# Patient Record
Sex: Female | Born: 1945 | Race: White | Hispanic: No | Marital: Married | Smoking: Former smoker
Health system: Southern US, Community
[De-identification: ages and names within clinical notes are randomized; demographics above are authoritative.]

## PROBLEM LIST (undated history)

## (undated) DIAGNOSIS — I219 Acute myocardial infarction, unspecified: Secondary | ICD-10-CM

## (undated) DIAGNOSIS — I2510 Atherosclerotic heart disease of native coronary artery without angina pectoris: Secondary | ICD-10-CM

## (undated) DIAGNOSIS — E785 Hyperlipidemia, unspecified: Secondary | ICD-10-CM

## (undated) HISTORY — DX: Hyperlipidemia, unspecified: E78.5

## (undated) HISTORY — DX: Atherosclerotic heart disease of native coronary artery without angina pectoris: I25.10

## (undated) HISTORY — DX: Acute myocardial infarction, unspecified: I21.9

## (undated) HISTORY — PX: HERNIA REPAIR: SHX51

## (undated) HISTORY — PX: TUBAL LIGATION: SHX77

---

## 2005-09-13 ENCOUNTER — Ambulatory Visit (HOSPITAL_COMMUNITY): Admission: RE | Admit: 2005-09-13 | Discharge: 2005-09-13 | Payer: Self-pay | Admitting: Obstetrics and Gynecology

## 2009-10-24 ENCOUNTER — Ambulatory Visit (HOSPITAL_COMMUNITY): Admission: RE | Admit: 2009-10-24 | Discharge: 2009-10-24 | Payer: Self-pay | Admitting: Family Medicine

## 2009-10-26 ENCOUNTER — Encounter: Payer: Self-pay | Admitting: Emergency Medicine

## 2009-10-27 ENCOUNTER — Ambulatory Visit: Payer: Self-pay | Admitting: Internal Medicine

## 2009-10-27 ENCOUNTER — Encounter: Payer: Self-pay | Admitting: Cardiology

## 2009-10-27 ENCOUNTER — Inpatient Hospital Stay (HOSPITAL_COMMUNITY): Admission: EM | Admit: 2009-10-27 | Discharge: 2009-10-28 | Payer: Self-pay | Admitting: Internal Medicine

## 2009-10-27 ENCOUNTER — Ambulatory Visit: Payer: Self-pay | Admitting: Surgery

## 2009-10-28 ENCOUNTER — Encounter: Payer: Self-pay | Admitting: Cardiology

## 2009-11-18 DIAGNOSIS — I2510 Atherosclerotic heart disease of native coronary artery without angina pectoris: Secondary | ICD-10-CM

## 2009-11-19 ENCOUNTER — Ambulatory Visit: Payer: Self-pay | Admitting: Cardiology

## 2009-11-19 DIAGNOSIS — R030 Elevated blood-pressure reading, without diagnosis of hypertension: Secondary | ICD-10-CM

## 2009-11-19 DIAGNOSIS — E782 Mixed hyperlipidemia: Secondary | ICD-10-CM | POA: Insufficient documentation

## 2009-12-20 ENCOUNTER — Encounter: Payer: Self-pay | Admitting: Cardiology

## 2009-12-30 ENCOUNTER — Ambulatory Visit: Payer: Self-pay | Admitting: Cardiology

## 2009-12-30 DIAGNOSIS — Z87891 Personal history of nicotine dependence: Secondary | ICD-10-CM

## 2010-02-13 ENCOUNTER — Encounter (INDEPENDENT_AMBULATORY_CARE_PROVIDER_SITE_OTHER): Payer: Self-pay | Admitting: *Deleted

## 2010-03-17 ENCOUNTER — Encounter: Payer: Self-pay | Admitting: Cardiology

## 2010-03-19 ENCOUNTER — Ambulatory Visit
Admission: RE | Admit: 2010-03-19 | Discharge: 2010-03-19 | Payer: Self-pay | Source: Home / Self Care | Attending: Cardiology | Admitting: Cardiology

## 2010-04-14 NOTE — Assessment & Plan Note (Signed)
Summary: EPH- [facility] 8/16   Visit Type:  Follow-up Primary Vanessa Fritz:  Clancy Gourd, NP   History of Present Illness: 65 year old woman presents to the office for the first time in followup of a recent hospital stay at [facility]. Records indicate that she was transferred from [facility] emergency room having presented with chest pain back in mid August. She ruled in for a non-ST elevation myocardial infarction and underwent cardiac catheterization demonstrating multivessel disease as noted below, treated with drug-eluting stent placement to the circumflex as well as left anterior descending.  Laboratory data from August showed a cholesterol of 177, triglyceride 100, HDL 50, LDL 107.  Medical therapy was initiated. She states that she had not been on long-term medications prior to this. I reviewed with her the findings of her cardiac catheterization, importance of continuing dual antiplatelet therapy, basic diet and exercise recommendations. We also discussed potential medication adjustments. She requested all generics if possible. We also discussed cardiac rehabilitation, although she was not interested in pursuing it at this point.  Preventive Screening-Counseling & Management  Alcohol-Tobacco     Smoking Status: quit     Year Quit: August/2011  Current Medications (verified): 1)  Tylenol Arthritis Pain 650 Mg Cr-Tabs (Acetaminophen) .... Take One By Mouth Every 4 Hours As Needed 2)  Metoprolol Tartrate 25 Mg Tabs (Metoprolol Tartrate) .... Take 1/2 Tablet By Mouth Two Times A Day 3)  Crestor 40 Mg Tabs (Rosuvastatin Calcium) .... Take 1 Tablet By Mouth Once A Day 4)  Nitrostat 0.4 Mg Subl (Nitroglycerin) .... Use As Directed Chest Pain 5)  Aspir-Trin 325 Mg Tbec (Aspirin) .... Take 1 Tablet By Mouth Once A Day 6)  Plavix 75 Mg Tabs (Clopidogrel Bisulfate) .... Take 1 Tablet By Mouth Once A Day 7)  Complete  Tabs (Multiple Vitamins-Minerals) .... Take 1 Tablet By Mouth  Once A Day  Allergies: 1)  ! Sulfa  Comments:  Nurse/Medical Assistant: The patient's medication list and allergies were reviewed with the patient and were updated in the Medication and Allergy Lists.  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         Only occasional use of Zantac. Occasional bruising. No major bleeding problems. Otherwise reviewed and negative.  Vital Signs:  Patient profile:   65 year old female Height:      59 inches Weight:      111 pounds BMI:     22.50 Pulse rate:   63 / minute BP sitting:   148 / 88  (left arm) Cuff size:   regular  Vitals Entered By: Carlye Grippe 11/26/09 9:51 AM)  Physical Exam  Additional Exam:  Normally nourished appearing woman in no acute distress. HEENT:  Conjunctiva and lids normal, oropharynx clear. Neck: Supple, no elevated JVP or bruits. Lungs: Clear auscultation, nonlabored. Cardiac: Regular rate and rhythm, no S3 or pericardial rub. Abdomen: Soft, nontender, bowel sounds present. Skin: Warm and dry, no residual ecchymosis. Musculoskeletal: No kyphosis. Extremities: No pitting edema, distal pulses 2+. Neuropsychiatric: Alert and oriented x3,  affect appropriate.   Cardiac Cath  Procedure date:  10/28/2009  Findings:      RESULTS:  The aortic pressure was 111/54 with mean of 78.  Left   ventricular pressure was 111/15.      The left main coronary artery:  The left main coronary artery was free   of significant disease.      The left anterior descending artery:  The left anterior descending   artery gave rise to two diagonal branches and three septal perforators.   There was a 90% stenosis in the mid LAD just before the second diagonal   branch followed by tandem 80 and 80% stenoses in the mid LAD.      The circumflex artery:  The circumflex artery gave rise to four   posterolateral branches.  There was a 40% proximal stenosis in the   circumflex artery.  There were 50 and 40% stenoses in the midvessel and   there was 95% stenosis in the distal vessel before two posterolateral   branches.      The right coronary artery:  The right coronary artery was a moderately   large vessel gave rise to a conus branch, a right ventricular branch,   another right ventricular branch that supplied part of the inferior   septum and a short posterior descending and a large posterolateral   branch.  There were irregularities in the proximal vessel but no   significant obstruction.      The left ventriculogram:  The left ventriculogram performed in the RAO   projection showed good wall motion with no areas of hypokinesis.  The   estimated ejection fraction was 65%.      Following stenting of the lesion in the distal circumflex artery, the   stenosis improved from 95% to 0%.      Following stenting of the tandem lesions in the mid LAD, the stenosis   improved from 90, 80, and 80% to 0%.  EKG  Procedure date:  11/19/2009  Findings:      Sinus rhythm at 64 beats per minute.  Impression & Recommendations:  Problem # 1:  CORONARY ATHEROSCLEROSIS NATIVE CORONARY ARTERY (ICD-414.01)  Symptomatically stable following recent  intervention, on medical therapy. Electrocardiogram is normal. Discussed basic diet and exercise measures. Followup anticipated in 6 weeks.  Her updated medication list for this problem includes:    Metoprolol Tartrate 25 Mg Tabs (Metoprolol tartrate) .Marland Kitchen... Take 1/2 tablet by mouth two times a day    Nitrostat 0.4 Mg Subl (Nitroglycerin) ..... Use as directed chest pain    Aspir-trin 325 Mg Tbec (Aspirin) .Marland Kitchen... Take 1 tablet by mouth once a day    Plavix 75 Mg Tabs (Clopidogrel bisulfate) .Marland Kitchen... Take 1 tablet by mouth once a day  Orders: EKG w/ Interpretation (93000)  Problem # 2:  MIXED HYPERLIPIDEMIA (ICD-272.2)  Plan to change from Crestor to simvastatin 20 g p.o. q.h.s. Followup fasting lipid profile and liver function tests in approximately 12 weeks.  The following medications were removed from the medication list:    Crestor 40 Mg Tabs (Rosuvastatin calcium) .Marland Kitchen... Take 1 tablet by mouth once a day Her updated  medication list for this problem includes:    Simvastatin 20 Mg Tabs (Simvastatin) .Marland Kitchen... Take one tablet by mouth daily at bedtime  Problem # 3:  ELEVATED BLOOD PRESSURE (ICD-796.2)  Reviewed blood pressure parameters with the patient. She states that she does check it at home, recently with a systolic of "112." I asked her to keep a daily log of blood pressure for 2 weeks. Plan to review this when she returns. We discussed sodium restriction.  Patient Instructions: 1)  Follow up with Dr. Diona Browner on Tuesday, December 30, 2009 at 1pm.  2)  Doreatha Martin current supply of Crestor then stop and start (Zocor) simvastatin 20mg  by mouth at bedtime. 3)  Your physician recommends that you go to the [facility] for a FASTING lipid profile and liver function labs:  DO IN 3 MONTHS. YOU WILL RECEIVE A REMINDER LETTER. Do not eat or drink after midnight.  Prescriptions: SIMVASTATIN 20 MG TABS (SIMVASTATIN) Take one tablet by mouth daily at bedtime  #30 x 6   Entered by:   Cyril Loosen, RN,  BSN   Authorized by:   Loreli Slot, MD, [facility]   Signed by:   Cyril Loosen, RN, BSN on 11/19/2009   Method used:   Electronically to        [facility] (retail)       [address]       [place], [place]  [address]       Ph: [phone]       Fax: [phone]   RxID:   [id number]

## 2010-04-14 NOTE — Progress Notes (Signed)
Summary: Office Visit/ BLOOD PRESSURE READINGS  Office Visit/ BLOOD PRESSURE READINGS   Imported By: Dorise Hiss 12/31/2009 14:53:47  _____________________________________________________________________  External Attachment:    Type:   Image     Comment:   External Document

## 2010-04-14 NOTE — Assessment & Plan Note (Signed)
Summary: 6 WK F/U PER 9/7 OV-JM   Visit Type:  Follow-up Primary Provider:  Clancy Gourd, NP   History of Present Illness: 65 year old woman presents for follow-up.  She was seen back in September.  She continues to do well, reporting no angina or unusual breathlessness. She' has been doing some walking with her dog. We discussed regular exercise today. No trouble with return to smoking since cessation back in August.  Follow-up labs from 7 October showed BUN 14, creatinine 0.6, potassium 4.9, AST 15, ALT 14, cholesterol 156, triglycerides 65, HDL 61, LDL 82.  We discussed these today. She is tolerating simvastatin.  She brought in a home blood pressure record which shows good control overall, systolic ranging from 104 up to a high of 120.  Clinical Review Panels:  Cardiac Imaging Cardiac Cath Findings   CARDIAC CATHETERIZATION   CONCLUSION:   1. Coronary artery disease with 90, 80, and 80% stenoses in the mid-       LAD, 40% proximal, 50% mid and 95% distal stenosis in the       circumflex artery, irregularities in the right coronary artery and       normal LV function.   2. Successful PCI of the lesion in the distal circumflex artery using       a PROMUS drug-eluting stent with improvement in center narrowing       from 95% to 0%.   3. Successful PCI of the tandem lesions in the mid LAD with       improvement in the percent diameter narrowing from 90, 80, and 80%       to 0%.      DISPOSITION:  The patient will return to post angio room for further   observation.  She is to remain on Plavix for at least a year.  We will   check a P2Y12 platelet inhibition test tomorrow.               Bruce Elvera Lennox Juanda Chance, MD, [facility]  (10/28/2009)    Preventive Screening-Counseling & Management  Alcohol-Tobacco     Smoking Status: quit     Year Quit: 10/2009  Current Medications (verified): 1)  Tylenol Arthritis Pain 650 Mg Cr-Tabs (Acetaminophen) .... Take One By Mouth Every 4 Hours As  Needed 2)  Metoprolol Tartrate 25 Mg Tabs (Metoprolol Tartrate) .... Take 1/2 Tablet By Mouth Two Times A Day 3)  Nitrostat 0.4 Mg Subl (Nitroglycerin) .... Use As Directed Chest Pain 4)  Aspir-Trin 325 Mg Tbec (Aspirin) .... Take 1 Tablet By Mouth Once A Day 5)  Plavix 75 Mg Tabs (Clopidogrel Bisulfate) .... Take 1 Tablet By Mouth Once A Day 6)  Complete  Tabs (Multiple Vitamins-Minerals) .... Take 1 Tablet By Mouth Once A Day 7)  Simvastatin 20 Mg Tabs (Simvastatin) .... Take One Tablet By Mouth Daily At Bedtime 8)  Vitamin B-12 250 Mcg Tabs (Cyanocobalamin) .... Take 1 Tablet By Mouth Once A Day 9)  Vitamin D 1000 Unit Tabs (Cholecalciferol) .... Take 1 Tablet By Mouth Once A Day  Allergies (verified): 1)  ! Sulfa  Comments:  Nurse/Medical Assistant: The patient's medication bottles and allergies were reviewed with the patient and were updated in the Medication and Allergy Lists.  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         No bleeding problems on aspirin and Plavix. Otherwise reviewed and negative.  Vital Signs:  Patient profile:   65 year old female Height:      59 inches Weight:      113 pounds Pulse rate:   79 / minute BP sitting:   146 / 77  (left arm) Cuff size:   regular  Vitals Entered By: Carlye Grippe (December 30, 2009 1:04 PM)  Physical Exam  Additional Exam:  Normally nourished appearing woman in no acute distress. HEENT: Conjunctiva and lids normal, oropharynx clear. Neck: Supple, no elevated JVP or bruits. Lungs: Clear auscultation, nonlabored. Cardiac: Regular  rate and rhythm, no S3 or pericardial rub. Abdomen: Soft, nontender, bowel sounds present. Skin: Warm and dry, no residual ecchymosis. Musculoskeletal: No kyphosis. Extremities: No pitting edema, distal pulses 2+. Neuropsychiatric: Alert and oriented x3, affect appropriate.   Impression & Recommendations:  Problem # 1:  CORONARY ATHEROSCLEROSIS NATIVE CORONARY ARTERY (ICD-414.01)  Symptomatically stable on present medical regimen. I stressed the importance of exercise and diet. Followup planned for 3 months.  Her updated medication list for this problem includes:    Metoprolol Tartrate 25 Mg Tabs (Metoprolol tartrate) .Marland Kitchen... Take 1/2 tablet by mouth two times a day    Nitrostat 0.4 Mg Subl (Nitroglycerin) ..... Use as directed chest pain    Aspir-trin 325 Mg Tbec (Aspirin) .Marland Kitchen... Take 1 tablet by mouth once a day    Plavix 75 Mg Tabs (Clopidogrel bisulfate) .Marland Kitchen... Take 1 tablet by mouth once a day  Problem # 2:  MIXED HYPERLIPIDEMIA (ICD-272.2)  Reasonable lipid control following switch from Crestor simvastatin.  Her updated medication list for this problem includes:    Simvastatin 20 Mg Tabs (Simvastatin) .Marland Kitchen... Take one tablet by mouth daily at bedtime  Her updated medication list for this problem includes:    Simvastatin 20 Mg Tabs (Simvastatin) .Marland Kitchen... Take one tablet by mouth daily at bedtime  Problem # 3:  ELEVATED BLOOD PRESSURE (ICD-796.2)  Blood pressure overall is very well-controlled based on home checks. It is elevated today. I asked her to keep an eye on this. We discussed sodium restriction.  Problem # 4:  TOBACCO USE, QUIT (ICD-V15.82)  Quit smoking in August. No issues with relapse at this point. Seems to have good family support.  Patient Instructions: 1)  Your physician wants you to follow-up in: 3 months. You will receive a reminder letter in the mail one-two months in advance. If you don't receive a letter, please call our office to schedule the follow-up  appointment. 2)  Your physician recommends that you continue on your current medications as directed. Please refer to the Current Medication list given to you today.

## 2010-04-14 NOTE — Miscellaneous (Signed)
Summary: Orders Update  Clinical Lists Changes  Orders: Added new Test order of T-Lipid Profile ([id number]) - Signed Added new Test order of T-Hepatic Function ([id number]) - Signed 

## 2010-04-16 NOTE — Letter (Signed)
Summary: Vanessa Fritz PRIMARY CARE OFFICE NOTE  [facility] NOTE   Imported By: Claudette Laws 03/18/2010 13:07:13  _____________________________________________________________________  External Attachment:    Type:   Image     Comment:   External Document

## 2010-04-16 NOTE — Assessment & Plan Note (Signed)
Summary: 3 MO FUL   Visit Type:  Follow-up Primary Provider:  Ninfa Linden, NP   History of Present Illness: 65 year old woman presents for followup. She was seen back in October. She reports doing reasonably well, without any active angina. She does report some family stress. Also having difficulty falling asleep. She tends to watch television and try to get to bed around 8 PM. Talked about some better sleep hygiene.  I spoke with her about a basic walking regimen today, also sodium restriction. Blood pressure remains mildly elevated. I suspect she will need additional medication if this does not come under better control.  Followup labs done just recently revealed BUN 14, creatinine 0.8, potassium 4.3, AST 8, ALT 9, total cholesterol 151, triglycerides 85, HDL 60, LDL 74. She is tolerating simvastatin well.  Preventive Screening-Counseling & Management  Alcohol-Tobacco     Smoking Status: quit     Year Quit: 2011  Current Medications (verified): 1)  Tylenol Arthritis Pain 650 Mg Cr-Tabs (Acetaminophen) .... Take One By Mouth Every 4 Hours As Needed 2)  Metoprolol Tartrate 25 Mg Tabs (Metoprolol Tartrate) .... Take 1/2 Tablet By Mouth Two Times A Day 3)  Nitrostat 0.4 Mg Subl (Nitroglycerin) .... Use As Directed Chest Pain 4)  Aspir-Trin 325 Mg Tbec (Aspirin) .... Take 1 Tablet By Mouth Once A Day 5)  Plavix 75 Mg Tabs (Clopidogrel Bisulfate) .... Take 1 Tablet By Mouth Once A Day 6)  Complete  Tabs (Multiple Vitamins-Minerals) .... Take 1 Tablet By Mouth Once A Day 7)  Simvastatin 20 Mg Tabs (Simvastatin) .... Take One Tablet By Mouth Daily At Bedtime 8)  Vitamin B-12 250 Mcg Tabs (Cyanocobalamin) .... Take 1 Tablet By Mouth Once A Day 9)  Vitamin D 2000 Unit Tabs (Cholecalciferol)  Allergies (verified): 1)  ! Sulfa  Comments:  Nurse/Medical Assistant: The patient's medications and allergies were reviewed with the patient and were updated in the Medication and Allergy Lists.  reviewed list w/ patient.  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         Reports some recent "sinus trouble," otherwise reviewed and negative.  Vital Signs:  Patient profile:   65 year old female Height:      59 inches Weight:      109 pounds BMI:     22.09 Pulse rate:   69 / minute BP sitting:   139 / 85  (left arm) Cuff size:   regular  Vitals Entered By: Fuller Plan CMA (March 19, 2010 9:24 AM)  Physical Exam  Additional Exam:  Normally nourished appearing woman in no acute distress. HEENT: Conjunctiva and lids normal, oropharynx clear. Neck: Supple, no elevated JVP or bruits. Lungs: Clear auscultation, nonlabored. Cardiac: Regular rate and rhythm, no S3 or pericardial rub. Abdomen: Soft, nontender, bowel sounds present. Extremities: No pitting edema, distal pulses 2+. Neuropsychiatric: Alert and oriented x3, affect appropriate.   Impression & Recommendations:  Problem # 1:  CORONARY ATHEROSCLEROSIS NATIVE CORONARY ARTERY (ICD-414.01)  Symptomatically stable on medical therapy. Anticipate routine followup in 6 months, sooner if needed.  Her updated medication list for this problem includes:    Metoprolol Tartrate 25 Mg Tabs (Metoprolol  tartrate) .Marland Kitchen... Take 1/2 tablet by mouth two times a day    Nitrostat 0.4 Mg Subl (Nitroglycerin) ..... Use as directed chest pain    Aspir-trin 325 Mg Tbec (Aspirin) .Marland Kitchen... Take 1 tablet by mouth once a day    Plavix 75 Mg Tabs (Clopidogrel bisulfate) .Marland Kitchen... Take 1 tablet by mouth once a day  Problem # 2:  TOBACCO USE, QUIT (ICD-V15.82)  Has not returned to smoking.  Problem # 3:   ELEVATED BLOOD PRESSURE (ICD-796.2)  This remains an issue. I reinforced sodium restriction and exercise. She is shortly not overweight. I suspect she may need to be placed on additional medical therapy. Further advancing metoprolol will probably not be of much benefit particularly in light of her low resting heart rate. Additional consideration would be for an ACE inhibitor as a next step.  Problem # 4:  MIXED HYPERLIPIDEMIA (ICD-272.2)  Lipid numbers look good. She is tolerating simvastatin well.  Her updated medication list for this problem includes:    Simvastatin 20 Mg Tabs (Simvastatin) .Marland Kitchen... Take one tablet by mouth daily at bedtime  Her updated medication list for this problem includes:    Simvastatin 20 Mg Tabs (Simvastatin) .Marland Kitchen... Take one tablet by mouth daily at bedtime  Patient Instructions: 1)  Your physician recommends that you continue on your current medications as directed. Please refer to the Current Medication list given to you today. 2)  Follow up in  6 months.

## 2010-05-29 LAB — POCT CARDIAC MARKERS
CKMB, poc: 1 ng/mL (ref 1.0–8.0)
CKMB, poc: 4.6 ng/mL (ref 1.0–8.0)
Troponin i, poc: <0.05 ng/mL (ref 0.00–0.09)

## 2010-05-29 LAB — BASIC METABOLIC PANEL
CO2: 25 mEq/L (ref 19–32)
Calcium: 9 mg/dL (ref 8.4–10.5)
Creatinine, Ser: 0.87 mg/dL (ref 0.4–1.2)
GFR calc non Af Amer: >60 mL/min (ref >60)
Glucose, Bld: 92 mg/dL (ref 70–99)
Sodium: 140 mEq/L (ref 135–145)

## 2010-05-29 LAB — CBC
HCT: 35.1 % — ABNORMAL LOW (ref 36.0–46.0)
Platelets: 232 10*3/uL (ref 150–400)
Platelets: 239 10*3/uL (ref 150–400)
RBC: 3.94 MIL/uL (ref 3.87–5.11)
RBC: 4.01 MIL/uL (ref 3.87–5.11)
RDW: 13 % (ref 11.5–15.5)
RDW: 12.9 % (ref 11.5–15.5)
WBC: 8.7 10*3/uL (ref 4.0–10.5)
WBC: 7.9 10*3/uL (ref 4.0–10.5)

## 2010-05-29 LAB — COMPREHENSIVE METABOLIC PANEL
ALT: 11 U/L (ref 0–35)
AST: 14 U/L (ref 0–37)
Albumin: 4 g/dL (ref 3.5–5.2)
Alkaline Phosphatase: 72 U/L (ref 39–117)
Chloride: 101 mEq/L (ref 96–112)
Potassium: 3.8 mEq/L (ref 3.5–5.1)
Sodium: 130 mEq/L — ABNORMAL LOW (ref 135–145)
Total Protein: 6 g/dL (ref 6.0–8.3)

## 2010-05-29 LAB — DIFFERENTIAL
Basophils Absolute: 0 10*3/uL (ref 0.0–0.1)
Basophils Relative: 1 % (ref 0–1)
Eosinophils Absolute: 0.1 10*3/uL (ref 0.0–0.7)
Eosinophils Relative: 1 % (ref 0–5)
Lymphocytes Relative: 24 % (ref 12–46)
Lymphs Abs: 2.1 10*3/uL (ref 0.7–4.0)
Monocytes Absolute: 0.6 10*3/uL (ref 0.1–1.0)
Monocytes Absolute: 0.4 10*3/uL (ref 0.1–1.0)
Monocytes Relative: 5 % (ref 3–12)
Neutro Abs: 5.9 10*3/uL (ref 1.7–7.7)

## 2010-05-29 LAB — CARDIAC PANEL(CRET KIN+CKTOT+MB+TROPI)
Total CK: 125 U/L (ref 7–177)
Troponin I: 1.86 ng/mL (ref 0.00–0.06)

## 2010-05-29 LAB — APTT: aPTT: 33 seconds (ref 24–37)

## 2010-05-29 LAB — PLATELET INHIBITION P2Y12
P2Y12 % Inhibition: 69 %
Platelet Function Baseline: 395 [PRU] (ref 194–418)

## 2010-05-29 LAB — HEPARIN LEVEL (UNFRACTIONATED): Heparin Unfractionated: 0.87 IU/mL — ABNORMAL HIGH (ref 0.30–0.70)

## 2010-05-29 LAB — LIPID PANEL
Cholesterol: 177 mg/dL (ref 0–200)
HDL: 50 mg/dL (ref >39)
Total CHOL/HDL Ratio: 3.5 RATIO
Triglycerides: 100 mg/dL (ref <150)

## 2010-08-26 ENCOUNTER — Encounter: Payer: Self-pay | Admitting: Cardiology

## 2010-09-14 ENCOUNTER — Other Ambulatory Visit (HOSPITAL_COMMUNITY): Payer: Self-pay | Admitting: Nurse Practitioner

## 2010-09-14 DIAGNOSIS — Z139 Encounter for screening, unspecified: Secondary | ICD-10-CM

## 2010-09-22 ENCOUNTER — Ambulatory Visit (INDEPENDENT_AMBULATORY_CARE_PROVIDER_SITE_OTHER): Payer: Medicare Other | Admitting: Cardiology

## 2010-09-22 ENCOUNTER — Encounter: Payer: Self-pay | Admitting: Cardiology

## 2010-09-22 VITALS — BP 141/82 | HR 67 | Ht 59.0 in | Wt 107.0 lb

## 2010-09-22 DIAGNOSIS — Z87891 Personal history of nicotine dependence: Secondary | ICD-10-CM

## 2010-09-22 DIAGNOSIS — E782 Mixed hyperlipidemia: Secondary | ICD-10-CM

## 2010-09-22 DIAGNOSIS — I2510 Atherosclerotic heart disease of native coronary artery without angina pectoris: Secondary | ICD-10-CM

## 2010-09-22 DIAGNOSIS — R030 Elevated blood-pressure reading, without diagnosis of hypertension: Secondary | ICD-10-CM

## 2010-09-22 NOTE — Patient Instructions (Signed)
Your physician wants you to follow-up in: 6 months. You will receive a reminder letter in the mail one-two months in advance. If you don't receive a letter, please call our office to schedule the follow-up appointment. Your physician recommends that you continue on your current medications as directed. Please refer to the Current Medication list given to you today. 

## 2010-09-22 NOTE — Assessment & Plan Note (Signed)
Continues with complete smoking cessation.

## 2010-09-22 NOTE — Assessment & Plan Note (Signed)
Symptomatically stable on medical therapy. Continue observation. No changes made to present regimen.

## 2010-09-22 NOTE — Assessment & Plan Note (Signed)
Patient reports typically better blood pressure measurements. Reminded her about sodium restriction and exercise.

## 2010-09-22 NOTE — Progress Notes (Signed)
Clinical Summary Vanessa Fritz is a 65 y.o.female presenting for followup. She was seen in January of this year.  Recent lab work with Dr. Jarold Motto showed BUN 15, creatinine 0.9, sodium 144, potassium 4.7, AST 11, ALT 10, cholesterol 155, triglycerides147, HDL 64, LDL 66. We reviewed these today.  She reports possible episode of angina last month, using nitroglycerin for the first time. She has had no recurrent symptoms. States that she has been walking outdoors in the mornings for exercise. ECG is reviewed below, normal. She is approaching one year out from her drug-eluting stent placement. She reports compliance with her dual antiplatelet therapy.  Has not returned to smoking. States that she has "quit for good."  Allergies  Allergen Reactions  . Sulfonamide Derivatives     REACTION: throat swelling    Current outpatient prescriptions:acetaminophen (TYLENOL) 650 MG CR tablet, Take 650 mg by mouth every 4 (four) hours as needed.  , Disp: , Rfl: ;  aspirin 325 MG EC tablet, Take 325 mg by mouth daily.  , Disp: , Rfl: ;  Cholecalciferol (VITAMIN D3) 400 UNITS CAPS, Take 2 capsules by mouth daily.  , Disp: , Rfl: ;  clopidogrel (PLAVIX) 75 MG tablet, Take 75 mg by mouth daily.  , Disp: , Rfl:  metoprolol tartrate (LOPRESSOR) 25 MG tablet, Take 12.5 mg by mouth 2 (two) times daily.  , Disp: , Rfl: ;  Multiple Vitamins-Minerals (COMPLETE) TABS, Take 1 tablet by mouth daily.  , Disp: , Rfl: ;  nitroGLYCERIN (NITROSTAT) 0.4 MG SL tablet, Place 0.4 mg under the tongue every 5 (five) minutes as needed.  , Disp: , Rfl: ;  simvastatin (ZOCOR) 20 MG tablet, Take 20 mg by mouth at bedtime.  , Disp: , Rfl:  vitamin B-12 (CYANOCOBALAMIN) 250 MCG tablet, Take 250 mcg by mouth daily.  , Disp: , Rfl: ;  DISCONTD: Cholecalciferol (VITAMIN D) 2000 UNITS tablet, Take 2,000 Units by mouth daily.  , Disp: , Rfl:   Past Medical History  Diagnosis Date  . Coronary atherosclerosis of native coronary artery    Multivessel, LVEF 65%, DES circumflex and LAD 8/11  . Hyperlipidemia     Social History Vanessa Fritz reports that she quit smoking about 11 months ago. Her smoking use included Cigarettes. She has a .6 pack-year smoking history. She has never used smokeless tobacco. Vanessa Fritz reports that she does not drink alcohol.  Review of Systems No palpitations or syncope. Reports a stable appetite. No melena or hematochezia. Otherwise negative.  Physical Examination Filed Vitals:   09/22/10 1306  BP: 141/82  Pulse: 67   Normally nourished appearing woman in no acute distress.  HEENT: Conjunctiva and lids normal, oropharynx clear.  Neck: Supple, no elevated JVP or bruits.  Lungs: Clear auscultation, nonlabored.  Cardiac: Regular rate and rhythm, no S3 or pericardial rub.  Abdomen: Soft, nontender, bowel sounds present.  Extremities: No pitting edema, distal pulses 2+.  Neuropsychiatric: Alert and oriented x3, affect appropriate.   ECG Normal sinus rhythm.   Problem List and Plan

## 2010-09-22 NOTE — Assessment & Plan Note (Signed)
Lipids are well controlled. Continue present regimen.

## 2010-10-20 ENCOUNTER — Other Ambulatory Visit: Payer: Self-pay | Admitting: *Deleted

## 2010-10-20 MED ORDER — CLOPIDOGREL BISULFATE 75 MG PO TABS: 75.0000 mg | ORAL_TABLET | Freq: Every day | ORAL | Status: DC

## 2010-10-20 MED ORDER — METOPROLOL TARTRATE 25 MG PO TABS: 12.5000 mg | ORAL_TABLET | Freq: Two times a day (BID) | ORAL | Status: DC

## 2010-10-27 ENCOUNTER — Ambulatory Visit (HOSPITAL_COMMUNITY)
Admission: RE | Admit: 2010-10-27 | Discharge: 2010-10-27 | Disposition: A | Discharge status: Home / Self Care | Payer: Medicare Other | Source: Ambulatory Visit | Attending: Nurse Practitioner | Admitting: Nurse Practitioner

## 2010-10-27 DIAGNOSIS — Z1231 Encounter for screening mammogram for malignant neoplasm of breast: Secondary | ICD-10-CM | POA: Insufficient documentation

## 2010-10-27 DIAGNOSIS — Z139 Encounter for screening, unspecified: Secondary | ICD-10-CM

## 2010-11-27 ENCOUNTER — Encounter (HOSPITAL_COMMUNITY): Payer: Self-pay | Admitting: Emergency Medicine

## 2010-11-27 ENCOUNTER — Emergency Department (HOSPITAL_COMMUNITY)
Admission: EM | Admit: 2010-11-27 | Discharge: 2010-11-27 | Disposition: A | Discharge status: Home / Self Care | Payer: Medicare Other | Attending: Emergency Medicine | Admitting: Emergency Medicine

## 2010-11-27 DIAGNOSIS — N390 Urinary tract infection, site not specified: Secondary | ICD-10-CM

## 2010-11-27 DIAGNOSIS — E785 Hyperlipidemia, unspecified: Secondary | ICD-10-CM | POA: Insufficient documentation

## 2010-11-27 DIAGNOSIS — Z7982 Long term (current) use of aspirin: Secondary | ICD-10-CM | POA: Insufficient documentation

## 2010-11-27 DIAGNOSIS — R109 Unspecified abdominal pain: Secondary | ICD-10-CM | POA: Insufficient documentation

## 2010-11-27 DIAGNOSIS — I252 Old myocardial infarction: Secondary | ICD-10-CM | POA: Insufficient documentation

## 2010-11-27 DIAGNOSIS — I2510 Atherosclerotic heart disease of native coronary artery without angina pectoris: Secondary | ICD-10-CM | POA: Insufficient documentation

## 2010-11-27 DIAGNOSIS — R300 Dysuria: Secondary | ICD-10-CM | POA: Insufficient documentation

## 2010-11-27 DIAGNOSIS — Z87891 Personal history of nicotine dependence: Secondary | ICD-10-CM | POA: Insufficient documentation

## 2010-11-27 LAB — CBC
Hemoglobin: 12.7 g/dL (ref 12.0–15.0)
MCH: 31.2 pg (ref 26.0–34.0)
MCV: 90.7 fL (ref 78.0–100.0)
Platelets: 250 10*3/uL (ref 150–400)
RBC: 4.07 MIL/uL (ref 3.87–5.11)

## 2010-11-27 LAB — DIFFERENTIAL
Eosinophils Absolute: 0.1 10*3/uL (ref 0.0–0.7)
Eosinophils Relative: 1 % (ref 0–5)
Lymphs Abs: 2 10*3/uL (ref 0.7–4.0)
Monocytes Relative: 7 % (ref 3–12)

## 2010-11-27 LAB — BASIC METABOLIC PANEL
BUN: 18 mg/dL (ref 6–23)
Calcium: 9.7 mg/dL (ref 8.4–10.5)
Creatinine, Ser: 0.99 mg/dL (ref 0.50–1.10)
GFR calc non Af Amer: 56 mL/min — ABNORMAL LOW (ref >60)
Glucose, Bld: 113 mg/dL — ABNORMAL HIGH (ref 70–99)

## 2010-11-27 LAB — URINALYSIS, ROUTINE W REFLEX MICROSCOPIC
Bilirubin Urine: NEGATIVE
Specific Gravity, Urine: 1.02 (ref 1.005–1.030)
Urobilinogen, UA: 1 mg/dL (ref 0.0–1.0)

## 2010-11-27 MED ORDER — SODIUM CHLORIDE 0.9 % IV SOLN
Freq: Once | INTRAVENOUS | Status: AC
  Administered 2010-11-27: 20:00:00 via INTRAVENOUS

## 2010-11-27 MED ORDER — CIPROFLOXACIN HCL 500 MG PO TABS: 500.0000 mg | ORAL_TABLET | Freq: Two times a day (BID) | ORAL | Status: AC

## 2010-11-27 MED ORDER — DEXTROSE 5 % IV SOLN
1.0000 g | Freq: Once | INTRAVENOUS | Status: AC
  Administered 2010-11-27: 1 g via INTRAVENOUS
  Filled 2010-11-27: qty 1

## 2010-11-27 NOTE — ED Provider Notes (Signed)
Scribed for Donnetta Hutching, MD, the patient was seen in room APAH1/APAH1 . This chart was scribed by Ellie Lunch. This patient's care was started at 8:36 PM.   CSN: [id number] Arrival date & time: 11/27/2010  7:10 PM   Chief Complaint  Patient presents with  . Flank Pain     (Include location/radiation/quality/duration/timing/severity/associated sxs/prior treatment) HPI RAECHAL RABEN is a 65 y.o. female who presents to the Emergency Department complaining of sudden onset of right flank pain starting this morning with associated dysuria, hematuria, and chills. Pt denies fever, n/v/d. There are no other associated symptoms and no other alleviating or aggravating factors.   PCP Dr. Jarold Motto  Cardiologist: Dr. Diona Browner at [facility]  HPI ELEMENTS:  Location: right flank  Onset: this morning  Timing: constant    Context: as above  Associated symptoms: dysuria, hematuria, chills   Past Medical History  Diagnosis Date  . Coronary atherosclerosis of native coronary artery     Multivessel, LVEF 65%, DES circumflex and LAD 8/11  . Hyperlipidemia   . Heart attack     Past Surgical History  Procedure Date  . Hernia repair     Family History  Problem Relation Age of Onset  . Coronary artery disease Mother     History of MI and stroke  . Coronary artery disease Brother     CABG in his 12s  . Coronary artery disease Brother     Died of MI age 71    History  Substance Use Topics  . Smoking status: Former Smoker -- 0.3 packs/day for 2 years    Types: Cigarettes    Quit date: 10/13/2009  . Smokeless tobacco: Never Used  . Alcohol Use: No    Review of Systems 10 Systems reviewed and are negative for acute change except as noted in the HPI.  Allergies  Sulfonamide derivatives  Home Medications   Current Outpatient Rx  Name Route Sig Dispense Refill  . ASPIRIN 325 MG PO TBEC Oral Take 325 mg by mouth daily.      Marland Kitchen VITAMIN D3 400 UNITS PO CAPS Oral Take 2 capsules by mouth  daily.      Marland Kitchen CLOPIDOGREL BISULFATE 75 MG PO TABS Oral Take 1 tablet (75 mg total) by mouth daily. 30 tablet 6  . METOPROLOL TARTRATE 25 MG PO TABS Oral Take 0.5 tablets (12.5 mg total) by mouth 2 (two) times daily. 30 tablet 6  . COMPLETE PO TABS Oral Take 1 tablet by mouth daily.      Marland Kitchen SIMVASTATIN 20 MG PO TABS Oral Take 20 mg by mouth at bedtime.      Marland Kitchen VITAMIN B-12 250 MCG PO TABS Oral Take 250 mcg by mouth daily.      . ACETAMINOPHEN 650 MG PO TBCR Oral Take 650 mg by mouth every 4 (four) hours as needed. For headaches    . NITROGLYCERIN 0.4 MG SL SUBL Sublingual Place 0.4 mg under the tongue every 5 (five) minutes as needed.        Physical Exam    BP 124/60  Pulse 90  Temp(Src) 98 F (36.7 C) (Oral)  Resp 20  SpO2 100%  Physical Exam  Nursing note and vitals reviewed. Constitutional: She is oriented to person, place, and time. She appears well-developed and well-nourished.  HENT:  Head: Normocephalic and atraumatic.  Eyes: Conjunctivae and EOM are normal.  Neck: Normal range of motion.  Cardiovascular: Normal rate, regular rhythm and normal heart sounds.   Pulmonary/Chest:  Effort normal and breath sounds normal.  Abdominal: Soft. Bowel sounds are normal.  Musculoskeletal: Normal range of motion.       Right flank tenderness  Neurological: She is alert and oriented to person, place, and time.  Skin: Skin is warm and dry.  Psychiatric: She has a normal mood and affect. Judgment normal.   Procedures  OTHER DATA REVIEWED: Nursing notes, vital signs, and past medical records reviewed.   DIAGNOSTIC STUDIES: Oxygen Saturation is 100% on room air, normal by my interpretation.    LABS / RADIOLOGY:  Results for orders placed during the hospital encounter of 11/27/10  URINALYSIS, ROUTINE W REFLEX MICROSCOPIC      Component Value Range   Color, Urine YELLOW  YELLOW    Appearance HAZY (*) CLEAR    Specific Gravity, Urine 1.020  1.005 - 1.030    pH 6.0  5.0 - 8.0     Glucose, UA NEGATIVE  NEGATIVE (mg/dL)   Hgb urine dipstick LARGE (*) NEGATIVE    Bilirubin Urine NEGATIVE  NEGATIVE    Ketones, ur NEGATIVE  NEGATIVE (mg/dL)   Protein, ur 30 (*) NEGATIVE (mg/dL)   Urobilinogen, UA 1.0  0.0 - 1.0 (mg/dL)   Nitrite POSITIVE (*) NEGATIVE    Leukocytes, UA LARGE (*) NEGATIVE   CBC      Component Value Range   WBC 12.0 (*) 4.0 - 10.5 (K/uL)   RBC 4.07  3.87 - 5.11 (MIL/uL)   Hemoglobin 12.7  12.0 - 15.0 (g/dL)   HCT 16.1  09.6 - 04.5 (%)   MCV 90.7  78.0 - 100.0 (fL)   MCH 31.2  26.0 - 34.0 (pg)   MCHC 34.4  30.0 - 36.0 (g/dL)   RDW 40.9  81.1 - 91.4 (%)   Platelets 250  150 - 400 (K/uL)  DIFFERENTIAL      Component Value Range   Neutrophils Relative 75  43 - 77 (%)   Neutro Abs 9.0 (*) 1.7 - 7.7 (K/uL)   Lymphocytes Relative 17  12 - 46 (%)   Lymphs Abs 2.0  0.7 - 4.0 (K/uL)   Monocytes Relative 7  3 - 12 (%)   Monocytes Absolute 0.8  0.1 - 1.0 (K/uL)   Eosinophils Relative 1  0 - 5 (%)   Eosinophils Absolute 0.1  0.0 - 0.7 (K/uL)   Basophils Relative 0  0 - 1 (%)   Basophils Absolute 0.0  0.0 - 0.1 (K/uL)  BASIC METABOLIC PANEL      Component Value Range   Sodium 134 (*) 135 - 145 (mEq/L)   Potassium 4.1  3.5 - 5.1 (mEq/L)   Chloride 98  96 - 112 (mEq/L)   CO2 30  19 - 32 (mEq/L)   Glucose, Bld 113 (*) 70 - 99 (mg/dL)   BUN 18  6 - 23 (mg/dL)   Creatinine, Ser 7.82  0.50 - 1.10 (mg/dL)   Calcium 9.7  8.4 - 95.6 (mg/dL)   GFR calc non Af Amer 56 (*) >60 (mL/min)   GFR calc Af Amer >60  >60 (mL/min)  URINE MICROSCOPIC-ADD ON      Component Value Range   Squamous Epithelial / LPF RARE  RARE    WBC, UA 21-50  <3 (WBC/hpf)   RBC / HPF TOO NUMEROUS TO COUNT  <3 (RBC/hpf)   Bacteria, UA MANY (*) RARE    PROCEDURES:  ED COURSE / COORDINATION OF CARE: 20:37 EDP at PT bedside. Discussed lab results indicating UTI.  Plan to treat with abx and discharge.   MDM: History and physical most consistent with urinary tract infection, stream linearly  Pyelo. Patient is alert, good color, normal vital signs, not dehydrated. Rx IV Rocephin in ED. Culture urine. Discharge home on Cipro.  IMPRESSION: Diagnoses that have been ruled out:  Diagnoses that are still under consideration:  Final diagnoses:    SCRIBE ATTESTATION: I personally performed the services described in this documentation, which was scribed in my presence. The recorded information has been reviewed and considered. Donnetta Hutching, MD           Donnetta Hutching, MD 11/27/10 [phone]

## 2010-11-27 NOTE — ED Notes (Signed)
Sudden onset rt flank pain with bloody urine

## 2011-01-25 ENCOUNTER — Other Ambulatory Visit: Payer: Self-pay | Admitting: *Deleted

## 2011-01-25 MED ORDER — SIMVASTATIN 20 MG PO TABS: 20.0000 mg | ORAL_TABLET | Freq: Every day | ORAL | Status: DC

## 2011-04-15 ENCOUNTER — Other Ambulatory Visit: Payer: Self-pay

## 2011-04-15 ENCOUNTER — Ambulatory Visit (INDEPENDENT_AMBULATORY_CARE_PROVIDER_SITE_OTHER): Payer: Medicare Other | Admitting: Cardiology

## 2011-04-15 ENCOUNTER — Telehealth: Payer: Self-pay | Admitting: Internal Medicine

## 2011-04-15 ENCOUNTER — Encounter: Payer: Self-pay | Admitting: Cardiology

## 2011-04-15 VITALS — BP 147/81 | HR 69 | Ht 59.0 in | Wt 119.0 lb

## 2011-04-15 DIAGNOSIS — I2510 Atherosclerotic heart disease of native coronary artery without angina pectoris: Secondary | ICD-10-CM

## 2011-04-15 DIAGNOSIS — Z139 Encounter for screening, unspecified: Secondary | ICD-10-CM

## 2011-04-15 DIAGNOSIS — E782 Mixed hyperlipidemia: Secondary | ICD-10-CM

## 2011-04-15 DIAGNOSIS — R030 Elevated blood-pressure reading, without diagnosis of hypertension: Secondary | ICD-10-CM

## 2011-04-15 MED ORDER — METOPROLOL SUCCINATE ER 25 MG PO TB24: 25.0000 mg | ORAL_TABLET | Freq: Every day | ORAL | Status: DC

## 2011-04-15 MED ORDER — NITROGLYCERIN 0.4 MG SL SUBL: 0.4000 mg | SUBLINGUAL_TABLET | SUBLINGUAL | Status: DC | PRN

## 2011-04-15 NOTE — Progress Notes (Signed)
   Clinical Summary Vanessa Fritz is a 66 y.o.female presenting for followup. She was seen in July 2012. She is here with her husband, doing well without any active angina. We reviewed her medications and addressed refills. Also discussed switching from Lopressor to Toprol-XL.  Recent lab work reviewed including BUN 18, creatinine 1.0, potassium 4.3, AST 15, ALT 13, cholesterol 155, triglycerides 80, HDL 61, LDL 78, hemoglobin A1c 5.5. I discussed these with her today. She is tolerating Zocor.  We discussed a basic walking regimen. She has not been consistent with this during the winter.   Allergies  Allergen Reactions  . Sulfonamide Derivatives     REACTION: throat swelling    Current Outpatient Prescriptions  Medication Sig Dispense Refill  . acetaminophen (TYLENOL) 650 MG CR tablet Take 650 mg by mouth every 4 (four) hours as needed. For headaches      . aspirin 325 MG EC tablet Take 325 mg by mouth daily.        . Cholecalciferol (VITAMIN D3) 400 UNITS CAPS Take 2 capsules by mouth daily.        . clopidogrel (PLAVIX) 75 MG tablet Take 1 tablet (75 mg total) by mouth daily.  30 tablet  6  . Multiple Vitamins-Minerals (COMPLETE) TABS Take 1 tablet by mouth daily.        . nitroGLYCERIN (NITROSTAT) 0.4 MG SL tablet Place 1 tablet (0.4 mg total) under the tongue every 5 (five) minutes as needed.  25 tablet  3  . simvastatin (ZOCOR) 20 MG tablet Take 1 tablet (20 mg total) by mouth at bedtime.  30 tablet  6  . vitamin B-12 (CYANOCOBALAMIN) 250 MCG tablet Take 250 mcg by mouth daily.        . metoprolol succinate (TOPROL-XL) 25 MG 24 hr tablet Take 1 tablet (25 mg total) by mouth daily.  30 tablet  6    Past Medical History  Diagnosis Date  . Coronary atherosclerosis of native coronary artery     Multivessel, LVEF 65%, DES circumflex and LAD 8/11  . Hyperlipidemia   . Heart attack     Past Surgical History  Procedure Date  . Hernia repair     Family History  Problem Relation  Age of Onset  . Coronary artery disease Mother     History of MI and stroke  . Coronary artery disease Brother     CABG in his 60s  . Coronary artery disease Brother     Died of MI age 49    Social History Ms. Domanski reports that she quit smoking about 18 months ago. Her smoking use included Cigarettes. She has a .6 pack-year smoking history. She has never used smokeless tobacco. Ms. Peeler reports that she does not drink alcohol.  Review of Systems No palpitations or syncope. Stable appetite. No reported bleeding problems. Otherwise reviewed and negative.  Physical Examination Filed Vitals:   04/15/11 1030  BP: 147/81  Pulse: 69   Normally nourished appearing woman in no acute distress.  HEENT: Conjunctiva and lids normal, oropharynx clear.  Neck: Supple, no elevated JVP or bruits.  Lungs: Clear auscultation, nonlabored.  Cardiac: Regular rate and rhythm, no S3 or pericardial rub.  Abdomen: Soft, nontender, bowel sounds present.  Extremities: No pitting edema, distal pulses 2+.  Skin: Warm and dry. Musculoskeletal: No kyphosis. Neuropsychiatric: Alert and oriented x3, affect appropriate.   ECG Reviewed in EMR.   Problem List and Plan

## 2011-04-15 NOTE — Telephone Encounter (Signed)
Pt came into office this afternoon wanting to be triaged for a colonoscopy. She said she had been referred by Ninfa Linden sometime ago and was ready to set this up. I told her to have a seat and the triage nurse would talk with her.

## 2011-04-15 NOTE — Assessment & Plan Note (Signed)
Blood pressure elevated. Once again discussed diet and exercise. May need further medical therapy. Keep followup with primary care provider.

## 2011-04-15 NOTE — Assessment & Plan Note (Signed)
Lipids are well controlled. Continue current regimen.

## 2011-04-15 NOTE — Telephone Encounter (Signed)
Gastroenterology Pre-Procedure Form   Pt said she was supposed to have done in 2011 but she had a heart attack and had to put off for awhile. She said that her cardiologist, Dr. Diona Browner said she would probably go off of Plavix and Asa for a few days Before the procedure.    Request Date: 04/15/2011        Requesting Physician: Ninfa Linden, NP     PATIENT INFORMATION:  Vanessa Fritz is a 66 y.o., female (DOB=[birth year]).  PROCEDURE: Procedure(s) requested: colonoscopy Procedure Reason: screening for colon cancer  PATIENT REVIEW QUESTIONS: The patient reports the following:   1. Diabetes Melitis: no 2. Joint replacements in the past 12 months: no 3. Major health problems in the past 3 months: no 4. Has an artificial valve or MVP:no 5. Has been advised in past to take antibiotics in advance of a procedure like teeth cleaning: no}    MEDICATIONS & ALLERGIES:    Patient reports the following regarding taking any blood thinners:   Plavix? yes  Aspirin?yes  Coumadin?  no  Patient confirms/reports the following medications:  Current Outpatient Prescriptions  Medication Sig Dispense Refill  . acetaminophen (TYLENOL) 650 MG CR tablet Take 650 mg by mouth every 4 (four) hours as needed. For headaches      . aspirin 325 MG EC tablet Take 325 mg by mouth daily.        . Cholecalciferol (VITAMIN D3) 400 UNITS CAPS Take 2 capsules by mouth daily.        . clopidogrel (PLAVIX) 75 MG tablet Take 1 tablet (75 mg total) by mouth daily.  30 tablet  6  . metoprolol succinate (TOPROL-XL) 25 MG 24 hr tablet Take 1 tablet (25 mg total) by mouth daily.  30 tablet  6  . Multiple Vitamins-Minerals (COMPLETE) TABS Take 1 tablet by mouth daily.        . nitroGLYCERIN (NITROSTAT) 0.4 MG SL tablet Place 1 tablet (0.4 mg total) under the tongue every 5 (five) minutes as needed.  25 tablet  3  . simvastatin (ZOCOR) 20 MG tablet Take 1 tablet (20 mg total) by mouth at bedtime.  30 tablet  6  . vitamin  B-12 (CYANOCOBALAMIN) 250 MCG tablet Take 250 mcg by mouth daily.          Patient confirms/reports the following allergies:  Allergies  Allergen Reactions  . Sulfonamide Derivatives     REACTION: throat swelling    Patient is appropriate to schedule for requested procedure(s): yes  AUTHORIZATION INFORMATION Primary Insurance:   ID #:   Group #:  Pre-Cert / Auth required:  Pre-Cert / Auth #:   Secondary Insurance:   ID #:  Group #:  Pre-Cert / Auth required:  Pre-Cert / Auth #:   No orders of the defined types were placed in this encounter.    SCHEDULE INFORMATION: Procedure has been scheduled as follows:  Date: 05/10/2011                  Time: 8:15 AM Location: [facility] Stay  This Gastroenterology Pre-Precedure Form is being routed to the following provider(s) for review: R. Roetta Sessions, MD

## 2011-04-15 NOTE — Assessment & Plan Note (Signed)
Symptomatically stable on medical therapy, ECG is normal. Plan to continue observation, switch from Lopressor to Toprol-XL. Followup arranged.

## 2011-04-15 NOTE — Telephone Encounter (Signed)
OK to schedule. She can stay on both Plavix and ASA. No need to stop.

## 2011-04-15 NOTE — Patient Instructions (Signed)
   Stop Lopressor   Change to Toprol XL 25mg  daily Your physician wants you to follow up in: 6 months.  You will receive a reminder letter in the mail one-two months in advance.  If you don't receive a letter, please call our office to schedule the follow up appointment

## 2011-04-19 NOTE — Progress Notes (Signed)
LMOM for pt to call with insurance info. ( Printed a new instructions sheet. First one had some things missing. I had not mailed it. So please disregard the first one printed in the chart.)

## 2011-04-19 NOTE — Telephone Encounter (Signed)
Rx and instructions mailed to pt.  

## 2011-05-06 ENCOUNTER — Encounter (HOSPITAL_COMMUNITY): Payer: Self-pay | Admitting: Pharmacy Technician

## 2011-05-07 MED ORDER — SODIUM CHLORIDE 0.45 % IV SOLN
Freq: Once | INTRAVENOUS | Status: AC
  Administered 2011-05-10: 08:00:00 via INTRAVENOUS

## 2011-05-10 ENCOUNTER — Ambulatory Visit (HOSPITAL_COMMUNITY)
Admission: RE | Admit: 2011-05-10 | Discharge: 2011-05-10 | Disposition: A | Discharge status: Home / Self Care | Payer: Medicare Other | Source: Ambulatory Visit | Attending: Internal Medicine | Admitting: Internal Medicine

## 2011-05-10 ENCOUNTER — Encounter (HOSPITAL_COMMUNITY): Payer: Self-pay

## 2011-05-10 ENCOUNTER — Encounter (HOSPITAL_COMMUNITY)
Admission: RE | Disposition: A | Discharge status: Home / Self Care | Payer: Self-pay | Source: Ambulatory Visit | Attending: Internal Medicine

## 2011-05-10 DIAGNOSIS — K5730 Diverticulosis of large intestine without perforation or abscess without bleeding: Secondary | ICD-10-CM

## 2011-05-10 DIAGNOSIS — E785 Hyperlipidemia, unspecified: Secondary | ICD-10-CM | POA: Insufficient documentation

## 2011-05-10 DIAGNOSIS — Z1211 Encounter for screening for malignant neoplasm of colon: Secondary | ICD-10-CM | POA: Insufficient documentation

## 2011-05-10 DIAGNOSIS — Z7982 Long term (current) use of aspirin: Secondary | ICD-10-CM | POA: Insufficient documentation

## 2011-05-10 DIAGNOSIS — Z139 Encounter for screening, unspecified: Secondary | ICD-10-CM

## 2011-05-10 HISTORY — PX: COLONOSCOPY: SHX5424

## 2011-05-10 SURGERY — COLONOSCOPY
Anesthesia: Moderate Sedation

## 2011-05-10 MED ORDER — MIDAZOLAM HCL 5 MG/5ML IJ SOLN
INTRAMUSCULAR | Status: DC | PRN
  Administered 2011-05-10: 1 mg via INTRAVENOUS
  Administered 2011-05-10: 2 mg via INTRAVENOUS

## 2011-05-10 MED ORDER — STERILE WATER FOR IRRIGATION IR SOLN
Status: DC | PRN
  Administered 2011-05-10: 08:00:00

## 2011-05-10 MED ORDER — MEPERIDINE HCL 100 MG/ML IJ SOLN
INTRAMUSCULAR | Status: AC
  Filled 2011-05-10: qty 1

## 2011-05-10 MED ORDER — MIDAZOLAM HCL 5 MG/5ML IJ SOLN
INTRAMUSCULAR | Status: AC
  Filled 2011-05-10: qty 10

## 2011-05-10 MED ORDER — MEPERIDINE HCL 100 MG/ML IJ SOLN
INTRAMUSCULAR | Status: DC | PRN
  Administered 2011-05-10 (×2): 25 mg via INTRAVENOUS

## 2011-05-10 NOTE — H&P (Signed)
Primary Care Physician:  Ninfa Linden, NP, NP Primary Gastroenterologist:  Dr. Jena Gauss Pre-Procedure History & Physical: HPI:  Vanessa Fritz is a 66 y.o. female is here for a screening colonoscopy. First ever colonoscopy. No GI symptoms. No family history of polyps or colon cancer.  Past Medical History  Diagnosis Date  . Coronary atherosclerosis of native coronary artery     Multivessel, LVEF 65%, DES circumflex and LAD 8/11  . Hyperlipidemia   . Heart attack     Past Surgical History  Procedure Date  . Hernia repair   . Cardiac catheterization   . Tubal ligation     Prior to Admission medications   Medication Sig Start Date End Date Taking? Authorizing Provider  acetaminophen (TYLENOL) 650 MG CR tablet Take 650 mg by mouth every 4 (four) hours as needed. For headaches   Yes Historical Provider, MD  aspirin 325 MG EC tablet Take 325 mg by mouth daily.     Yes Historical Provider, MD  Cholecalciferol (VITAMIN D3) 400 UNITS CAPS Take 2 capsules by mouth daily.     Yes Historical Provider, MD  clopidogrel (PLAVIX) 75 MG tablet Take 1 tablet (75 mg total) by mouth daily. 10/20/10  Yes Jonelle Sidle, MD  metoprolol succinate (TOPROL-XL) 25 MG 24 hr tablet Take 1 tablet (25 mg total) by mouth daily. 04/15/11 04/14/12 Yes Jonelle Sidle, MD  Multiple Vitamins-Minerals (COMPLETE) TABS Take 1 tablet by mouth daily.     Yes Historical Provider, MD  simvastatin (ZOCOR) 20 MG tablet Take 1 tablet (20 mg total) by mouth at bedtime. 01/25/11  Yes Jonelle Sidle, MD  nitroGLYCERIN (NITROSTAT) 0.4 MG SL tablet Place 0.4 mg under the tongue every 5 (five) minutes as needed. For chest pain 04/15/11   Jonelle Sidle, MD  vitamin B-12 (CYANOCOBALAMIN) 250 MCG tablet Take 250 mcg by mouth daily.      Historical Provider, MD    Allergies as of 04/15/2011 - Review Complete 04/15/2011  Allergen Reaction Noted  . Sulfonamide derivatives      Family History  Problem Relation Age of Onset    . Coronary artery disease Mother     History of MI and stroke  . Coronary artery disease Brother     CABG in his 39s  . Coronary artery disease Brother     Died of MI age 33    History   Social History  . Marital Status: Married    Spouse Name: N/A    Number of Children: N/A  . Years of Education: N/A   Occupational History  . Not on file.   Social History Main Topics  . Smoking status: Former Smoker -- 0.3 packs/day for 2 years    Types: Cigarettes    Quit date: 10/13/2009  . Smokeless tobacco: Never Used  . Alcohol Use: No  . Drug Use: No  . Sexually Active: Not on file   Other Topics Concern  . Not on file   Social History Narrative  . No narrative on file    Review of Systems: See HPI, otherwise negative ROS  Physical Exam: BP 132/69  Pulse 73  Temp(Src) 97.7 F (36.5 C) (Oral)  Resp 16  Ht 4\' 11"  (1.499 m)  Wt 112 lb (50.803 kg)  BMI 22.62 kg/m2  SpO2 99% General:   Alert,  Well-developed, well-nourished, pleasant and cooperative in NAD Head:  Normocephalic and atraumatic. Eyes:  Sclera clear, no icterus.   Conjunctiva pink. Ears:  Normal  auditory acuity. Nose:  No deformity, discharge,  or lesions. Mouth:  No deformity or lesions, dentition normal. Neck:  Supple; no masses or thyromegaly. Lungs:  Clear throughout to auscultation.   No wheezes, crackles, or rhonchi. No acute distress. Heart:  Regular rate and rhythm; no murmurs, clicks, rubs,  or gallops. Abdomen:  Soft, nontender and nondistended. No masses, hepatosplenomegaly or hernias noted. Normal bowel sounds, without guarding, and without rebound.   Msk:  Symmetrical without gross deformities. Normal posture. Pulses:  Normal pulses noted. Extremities:  Without clubbing or edema. Neurologic:  Alert and  oriented x4;  grossly normal neurologically. Skin:  Intact without significant lesions or rashes. Cervical Nodes:  No significant cervical adenopathy. Psych:  Alert and cooperative. Normal  mood and affect.  Impression/Plan: Vanessa Fritz is now here to undergo a screening colonoscopy.  First ever average risk screening examination.  Risks, benefits, limitations, imponderables and alternatives regarding colonoscopy have been reviewed with the patient. Questions have been answered. All parties agreeable.

## 2011-05-10 NOTE — Op Note (Signed)
[facility] [address] [place], [place]  [address]  COLONOSCOPY PROCEDURE REPORT  PATIENT:  Vanessa Fritz, Vanessa Fritz  MR#:  [id number] BIRTHDATE:  06-19-1945, 65 yrs. old  GENDER:  female ENDOSCOPIST:  R. Roetta Sessions, MD FACP [facility] REF. BY:  Ninfa Linden, NP PROCEDURE DATE:  05/10/2011 PROCEDURE:  Screening colonoscopy  INDICATIONS:  First-ever average risk screening colonoscopy  INFORMED CONSENT:  The risks, benefits, alternatives and imponderables including but not limited to bleeding, perforation as well as the possibility of a missed lesion have been reviewed. The potential for biopsy, lesion removal, etc. have also been discussed.  Questions have been answered.  All parties agreeable. Please see the history and physical in the medical record for more information.  MEDICATIONS:  Versed 3 mg IV and Demerol 50 mg IV in divided doses  DESCRIPTION OF PROCEDURE:  After a digital rectal exam was performed, the EC-[id number] ([id number]) colonoscope was advanced from the anus through the rectum and colon to the area of the cecum, ileocecal valve and appendiceal orifice.  The cecum was deeply intubated.  These structures were well-seen and photographed for the record.  From the level of the cecum and ileocecal valve, the scope was slowly and cautiously withdrawn.  The mucosal surfaces were carefully surveyed utilizing scope tip deflection to facilitate fold flattening as needed.  The scope was pulled down into the rectum where a thorough examination including retroflexion was performed. <<PROCEDUREIMAGES>>  FINDINGS:   Adequate preparation.   A least one sigmoid diverticulum; remainder of colonic mucosa appeared normal.  THERAPEUTIC / DIAGNOSTIC MANEUVERS PERFORMED: None  COMPLICATIONS:  None  CECAL WITHDRAWAL TIME:    7 minutes  IMPRESSION:  Colonic diverticulosis.  RECOMMENDATIONS:   Consider one more screening colonoscopy in  10 years.  ______________________________ R. Roetta Sessions, MD Caleen Essex  CC:  Ninfa Linden, NP  n. eSIGNED:   R. Roetta Sessions at 05/10/2011 08:49 AM  Fransisco Beau, [phone]

## 2011-05-10 NOTE — Discharge Instructions (Signed)
  Colonoscopy °Discharge Instructions ° °Read the instructions outlined below and refer to this sheet in the next few weeks. These discharge instructions provide you with general information on caring for yourself after you leave the hospital. Your doctor may also give you specific instructions. While your treatment has been planned according to the most current medical practices available, unavoidable complications occasionally occur. If you have any problems or questions after discharge, call Dr. Jishnu Jenniges at [phone]. °ACTIVITY °· You may resume your regular activity, but move at a slower pace for the next 24 hours.  °· Take frequent rest periods for the next 24 hours.  °· Walking will help get rid of the air and reduce the bloated feeling in your belly (abdomen).  °· No driving for 24 hours (because of the medicine (anesthesia) used during the test).   °· Do not sign any important legal documents or operate any machinery for 24 hours (because of the anesthesia used during the test).  °NUTRITION °· Drink plenty of fluids.  °· You may resume your normal diet as instructed by your doctor.  °· Begin with a light meal and progress to your normal diet. Heavy or fried foods are harder to digest and may make you feel sick to your stomach (nauseated).  °· Avoid alcoholic beverages for 24 hours or as instructed.  °MEDICATIONS °· You may resume your normal medications unless your doctor tells you otherwise.  °WHAT YOU CAN EXPECT TODAY °· Some feelings of bloating in the abdomen.  °· Passage of more gas than usual.  °· Spotting of blood in your stool or on the toilet paper.  °IF YOU HAD POLYPS REMOVED DURING THE COLONOSCOPY: °· No aspirin products for 7 days or as instructed.  °· No alcohol for 7 days or as instructed.  °· Eat a soft diet for the next 24 hours.  °FINDING OUT THE RESULTS OF YOUR TEST °Not all test results are available during your visit. If your test results are not back during the visit, make an appointment  with your caregiver to find out the results. Do not assume everything is normal if you have not heard from your caregiver or the medical facility. It is important for you to follow up on all of your test results.  °SEEK IMMEDIATE MEDICAL ATTENTION IF: °· You have more than a spotting of blood in your stool.  °· Your belly is swollen (abdominal distention).  °· You are nauseated or vomiting.  °· You have a temperature over 101.  °· You have abdominal pain or discomfort that is severe or gets worse throughout the day.  ° ° °Diverticulosis information provided ° °Repeat screening colonoscopy in 10 years °

## 2011-05-17 ENCOUNTER — Encounter (HOSPITAL_COMMUNITY): Payer: Self-pay | Admitting: Internal Medicine

## 2011-06-01 ENCOUNTER — Other Ambulatory Visit: Payer: Self-pay | Admitting: *Deleted

## 2011-06-01 MED ORDER — CLOPIDOGREL BISULFATE 75 MG PO TABS: 75.0000 mg | ORAL_TABLET | Freq: Every day | ORAL | Status: DC

## 2011-09-14 ENCOUNTER — Other Ambulatory Visit (HOSPITAL_COMMUNITY): Payer: Self-pay | Admitting: Nurse Practitioner

## 2011-09-14 DIAGNOSIS — Z139 Encounter for screening, unspecified: Secondary | ICD-10-CM

## 2011-09-15 ENCOUNTER — Other Ambulatory Visit: Payer: Self-pay | Admitting: Cardiology

## 2011-09-15 ENCOUNTER — Other Ambulatory Visit: Payer: Self-pay

## 2011-09-15 MED ORDER — SIMVASTATIN 20 MG PO TABS: 20.0000 mg | ORAL_TABLET | Freq: Every day | ORAL | Status: DC

## 2011-10-12 ENCOUNTER — Ambulatory Visit: Payer: Medicare Other | Admitting: Cardiology

## 2011-10-14 ENCOUNTER — Ambulatory Visit: Payer: Medicare Other | Admitting: Cardiology

## 2011-10-28 ENCOUNTER — Ambulatory Visit (INDEPENDENT_AMBULATORY_CARE_PROVIDER_SITE_OTHER): Payer: Medicare Other | Admitting: Cardiology

## 2011-10-28 ENCOUNTER — Encounter: Payer: Self-pay | Admitting: Cardiology

## 2011-10-28 VITALS — BP 140/70 | HR 67 | Ht 59.0 in | Wt 116.2 lb

## 2011-10-28 DIAGNOSIS — E782 Mixed hyperlipidemia: Secondary | ICD-10-CM

## 2011-10-28 DIAGNOSIS — I2510 Atherosclerotic heart disease of native coronary artery without angina pectoris: Secondary | ICD-10-CM

## 2011-10-28 NOTE — Patient Instructions (Addendum)

## 2011-10-28 NOTE — Assessment & Plan Note (Signed)
Lipid numbers look good, LDL at goal. Continue same.

## 2011-10-28 NOTE — Assessment & Plan Note (Signed)
Symptomatically stable on medical therapy. Followup ECG shows sinus rhythm with LVH. Continue regular walking regimen. Followup arranged.

## 2011-10-28 NOTE — Progress Notes (Signed)
   Clinical Summary [name] is a 66 y.o.female presenting for followup. She was seen in the office in January. She reports no angina, states that she has been doing well, walking up to 2 miles at a local track most days of the week.  She reports compliance with her medications, does have bruising on DAPT. We discussed cutting her aspirin back to 81 mg daily.  Recent lab work from July reviewed showing potassium 4.4, BUN 19, creatinine 0.9, AST 16, ALT 15, cholesterol 157, triglycerides 84, HDL 62, LDL 78, hemoglobin A1c 5.5.   Allergies  Allergen Reactions  . Sulfonamide Derivatives     REACTION: throat swelling    Current Outpatient Prescriptions  Medication Sig Dispense Refill  . acetaminophen (TYLENOL) 650 MG CR tablet Take 650 mg by mouth every 4 (four) hours as needed. For headaches      . alendronate (FOSAMAX) 70 MG tablet Take 70 mg by mouth every 7 (seven) days.       Marland Kitchen aspirin 325 MG EC tablet Take 325 mg by mouth daily.        . Cholecalciferol (VITAMIN D3) 400 UNITS CAPS Take 2 capsules by mouth daily.        . clopidogrel (PLAVIX) 75 MG tablet Take 1 tablet (75 mg total) by mouth daily.  30 tablet  6  . metoprolol succinate (TOPROL-XL) 25 MG 24 hr tablet Take 1 tablet (25 mg total) by mouth daily.  30 tablet  6  . Multiple Vitamins-Minerals (COMPLETE) TABS Take 1 tablet by mouth daily.        . nitroGLYCERIN (NITROSTAT) 0.4 MG SL tablet Place 0.4 mg under the tongue every 5 (five) minutes as needed. For chest pain       . simvastatin (ZOCOR) 20 MG tablet Take 1 tablet (20 mg total) by mouth at bedtime.  30 tablet  6  . vitamin B-12 (CYANOCOBALAMIN) 250 MCG tablet Take 250 mcg by mouth daily.          Past Medical History  Diagnosis Date  . Coronary atherosclerosis of native coronary artery     Multivessel, LVEF 65%, DES circumflex and LAD 8/11  . Hyperlipidemia   . Heart attack     Social History [name] reports that she quit smoking about 2 years ago.  Her smoking use included Cigarettes. She has a .6 pack-year smoking history. She has never used smokeless tobacco. [name] reports that she does not drink alcohol.  Review of Systems No palpitations. No orthopnea or PND. Stable appetite. Otherwise negative.  Physical Examination Filed Vitals:   10/28/11 0939  BP: 140/70  Pulse: 67    Normally nourished appearing woman in no acute distress.  HEENT: Conjunctiva and lids normal, oropharynx clear.  Neck: Supple, no elevated JVP or bruits.  Lungs: Clear auscultation, nonlabored.  Cardiac: Regular rate and rhythm, no S3 or pericardial rub.  Abdomen: Soft, nontender, bowel sounds present.  Extremities: No pitting edema, distal pulses 2+.  Skin: Warm and dry.  Musculoskeletal: No kyphosis.  Neuropsychiatric: Alert and oriented x3, affect appropriate.   Problem List and Plan   CORONARY ATHEROSCLEROSIS NATIVE CORONARY ARTERY Symptomatically stable on medical therapy. Followup ECG shows sinus rhythm with LVH. Continue regular walking regimen. Followup arranged.  MIXED HYPERLIPIDEMIA Lipid numbers look good, LDL at goal. Continue same.    Jonelle Sidle, M.D., F.A.C.C.

## 2011-10-29 ENCOUNTER — Ambulatory Visit (HOSPITAL_COMMUNITY)
Admission: RE | Admit: 2011-10-29 | Discharge: 2011-10-29 | Disposition: A | Discharge status: Home / Self Care | Payer: Medicare Other | Source: Ambulatory Visit | Attending: Nurse Practitioner | Admitting: Nurse Practitioner

## 2011-10-29 DIAGNOSIS — Z1231 Encounter for screening mammogram for malignant neoplasm of breast: Secondary | ICD-10-CM | POA: Insufficient documentation

## 2011-10-29 DIAGNOSIS — Z139 Encounter for screening, unspecified: Secondary | ICD-10-CM

## 2011-10-31 ENCOUNTER — Encounter (HOSPITAL_COMMUNITY): Payer: Self-pay | Admitting: *Deleted

## 2011-10-31 ENCOUNTER — Emergency Department (HOSPITAL_COMMUNITY)
Admission: EM | Admit: 2011-10-31 | Discharge: 2011-10-31 | Disposition: A | Discharge status: Home / Self Care | Payer: Medicare Other | Attending: Emergency Medicine | Admitting: Emergency Medicine

## 2011-10-31 DIAGNOSIS — Z87891 Personal history of nicotine dependence: Secondary | ICD-10-CM | POA: Insufficient documentation

## 2011-10-31 DIAGNOSIS — Z882 Allergy status to sulfonamides status: Secondary | ICD-10-CM | POA: Insufficient documentation

## 2011-10-31 DIAGNOSIS — N390 Urinary tract infection, site not specified: Secondary | ICD-10-CM

## 2011-10-31 DIAGNOSIS — I2510 Atherosclerotic heart disease of native coronary artery without angina pectoris: Secondary | ICD-10-CM | POA: Insufficient documentation

## 2011-10-31 DIAGNOSIS — E785 Hyperlipidemia, unspecified: Secondary | ICD-10-CM | POA: Insufficient documentation

## 2011-10-31 LAB — URINE MICROSCOPIC-ADD ON

## 2011-10-31 LAB — URINALYSIS, ROUTINE W REFLEX MICROSCOPIC
Bilirubin Urine: NEGATIVE
Ketones, ur: NEGATIVE mg/dL
Nitrite: NEGATIVE
Protein, ur: NEGATIVE mg/dL
pH: 5.5 (ref 5.0–8.0)

## 2011-10-31 MED ORDER — CEPHALEXIN 500 MG PO CAPS
500.0000 mg | ORAL_CAPSULE | Freq: Once | ORAL | Status: AC
  Administered 2011-10-31: 500 mg via ORAL
  Filled 2011-10-31: qty 1

## 2011-10-31 MED ORDER — CEPHALEXIN 500 MG PO CAPS: 500.0000 mg | ORAL_CAPSULE | Freq: Four times a day (QID) | ORAL | Status: AC

## 2011-10-31 MED ORDER — HYDROCODONE-ACETAMINOPHEN 5-325 MG PO TABS: 1.0000 | ORAL_TABLET | ORAL | Status: AC | PRN

## 2011-10-31 MED ORDER — HYDROCODONE-ACETAMINOPHEN 5-325 MG PO TABS
1.0000 | ORAL_TABLET | Freq: Once | ORAL | Status: AC
  Administered 2011-10-31: 1 via ORAL
  Filled 2011-10-31: qty 1

## 2011-10-31 NOTE — ED Notes (Signed)
Pt c/o urinary frequency, pain and burning with urination since last Wednesday. Also c/o lower back pain.

## 2011-10-31 NOTE — ED Provider Notes (Signed)
History    This chart was scribed for Vanessa Melter, MD, MD by Smitty Pluck. The patient was seen in room [facility] and the patient's care was started at 1:20PM.   CSN: [id number]  Arrival date & time 10/31/11  1257   First MD Initiated Contact with Patient 10/31/11 1317      Chief Complaint  Patient presents with  . Urinary Frequency    (Consider location/radiation/quality/duration/timing/severity/associated sxs/prior treatment) Patient is a 66 y.o. female presenting with frequency. The history is provided by the patient.  Urinary Frequency   BIANA HAGGAR is a 66 y.o. female who presents to the Emergency Department complaining of moderate lower back pain radiating to right flank and dysuria onset 4 days ago. Pt reports that she increased urinary frequency. Pt reports being constipated 1 day ago but had normal bowel movement today. Pt reports similar symptoms 1 year ago. Pt has hx of kidney stone.   Past Medical History  Diagnosis Date  . Coronary atherosclerosis of native coronary artery     Multivessel, LVEF 65%, DES circumflex and LAD 8/11  . Hyperlipidemia   . Heart attack     Past Surgical History  Procedure Date  . Hernia repair   . Cardiac catheterization   . Tubal ligation   . Colonoscopy 05/10/2011    Procedure: COLONOSCOPY;  Surgeon: Corbin Ade, MD;  Location: [facility];  Service: Endoscopy;  Laterality: N/A;  8:15 AM    Family History  Problem Relation Age of Onset  . Coronary artery disease Mother     History of MI and stroke  . Coronary artery disease Brother     CABG in his 19s  . Coronary artery disease Brother     Died of MI age 20    History  Substance Use Topics  . Smoking status: Former Smoker -- 0.3 packs/day for 2 years    Types: Cigarettes    Quit date: 10/13/2009  . Smokeless tobacco: Never Used  . Alcohol Use: No    OB History    Grav Para Term Preterm Abortions TAB SAB Ect Mult Living                  Review of Systems   Genitourinary: Positive for frequency.  All other systems reviewed and are negative.   10 Systems reviewed and all are negative for acute change except as noted in the HPI.   Allergies  Sulfonamide derivatives  Home Medications   Current Outpatient Rx  Name Route Sig Dispense Refill  . ACETAMINOPHEN ER 650 MG PO TBCR Oral Take 650 mg by mouth every 4 (four) hours as needed. For headaches    . ALENDRONATE SODIUM 70 MG PO TABS Oral Take 70 mg by mouth every 7 (seven) days.     . ASPIRIN 325 MG PO TBEC Oral Take 325 mg by mouth daily.      Marland Kitchen VITAMIN D 1000 UNITS PO TABS Oral Take 2,000 Units by mouth daily.    Marland Kitchen CLOPIDOGREL BISULFATE 75 MG PO TABS Oral Take 1 tablet (75 mg total) by mouth daily. 30 tablet 6  . METOPROLOL SUCCINATE ER 25 MG PO TB24 Oral Take 1 tablet (25 mg total) by mouth daily. 30 tablet 6  . ADULT MULTIVITAMIN W/MINERALS CH Oral Take 1 tablet by mouth daily.    Marland Kitchen NITROGLYCERIN 0.4 MG SL SUBL Sublingual Place 0.4 mg under the tongue every 5 (five) minutes as needed. For chest pain     .  SIMVASTATIN 20 MG PO TABS Oral Take 1 tablet (20 mg total) by mouth at bedtime. 30 tablet 6  . VITAMIN B-12 1000 MCG PO TABS Oral Take 1,000 mcg by mouth daily.    Marland Kitchen VITAMIN C 500 MG PO TABS Oral Take 500 mg by mouth daily.    . CEPHALEXIN 500 MG PO CAPS Oral Take 1 capsule (500 mg total) by mouth 4 (four) times daily. 20 capsule 0  . HYDROCODONE-ACETAMINOPHEN 5-325 MG PO TABS Oral Take 1 tablet by mouth every 4 (four) hours as needed for pain. 15 tablet 0    BP 169/64  Pulse 82  Temp 97.5 F (36.4 C) (Oral)  Resp 20  Ht 4\' 11"  (1.499 m)  Wt 116 lb (52.617 kg)  BMI 23.43 kg/m2  SpO2 100%  Physical Exam  Nursing note and vitals reviewed. Constitutional: She is oriented to person, place, and time. She appears well-developed and well-nourished. No distress.  HENT:  Head: Normocephalic and atraumatic.  Eyes: Conjunctivae are normal. Pupils are equal, round, and reactive to  light.  Cardiovascular: Normal rate, regular rhythm and normal heart sounds.   Pulmonary/Chest: Effort normal. No respiratory distress. She has no wheezes.  Genitourinary:       No CVA tenderness   Musculoskeletal: She exhibits no tenderness.  Neurological: She is alert and oriented to person, place, and time.  Skin: Skin is warm and dry.  Psychiatric: She has a normal mood and affect. Her behavior is normal.    ED Course  Procedures (including critical care time) DIAGNOSTIC STUDIES: Oxygen Saturation is 100% on room air, normal by my interpretation.    COORDINATION OF CARE: 1:24PM Discussed pt ED treatment with pt     Labs Reviewed  URINALYSIS, ROUTINE W REFLEX MICROSCOPIC - Abnormal; Notable for the following:    Specific Gravity, Urine <1.005 (*)     Hgb urine dipstick SMALL (*)     Leukocytes, UA SMALL (*)     All other components within normal limits  URINE MICROSCOPIC-ADD ON   No results found.   1. UTI (lower urinary tract infection)       MDM  UTI, without complicating features. Doubt kidney stone. Doubt metabolic instability, serious bacterial infection or impending vascular collapse; the patient is stable for discharge.    I personally performed the services described in this documentation, which was scribed in my presence. The recorded information has been reviewed and considered.     Plan: Home Medications- Norco, Keflex; Home Treatments- Fluids; Recommended follow up- PCP 1 week     Vanessa Melter, MD 10/31/11 2154

## 2012-02-07 ENCOUNTER — Other Ambulatory Visit: Payer: Self-pay | Admitting: *Deleted

## 2012-02-07 MED ORDER — METOPROLOL SUCCINATE ER 25 MG PO TB24: 25.0000 mg | ORAL_TABLET | Freq: Every day | ORAL | Status: DC

## 2012-04-25 ENCOUNTER — Ambulatory Visit (INDEPENDENT_AMBULATORY_CARE_PROVIDER_SITE_OTHER): Payer: Medicare Other | Admitting: Cardiology

## 2012-04-25 ENCOUNTER — Encounter: Payer: Self-pay | Admitting: Cardiology

## 2012-04-25 VITALS — BP 150/74 | HR 75 | Ht 59.0 in | Wt 115.0 lb

## 2012-04-25 DIAGNOSIS — R030 Elevated blood-pressure reading, without diagnosis of hypertension: Secondary | ICD-10-CM

## 2012-04-25 DIAGNOSIS — E782 Mixed hyperlipidemia: Secondary | ICD-10-CM

## 2012-04-25 DIAGNOSIS — I2510 Atherosclerotic heart disease of native coronary artery without angina pectoris: Secondary | ICD-10-CM

## 2012-04-25 MED ORDER — SIMVASTATIN 20 MG PO TABS: 20.0000 mg | ORAL_TABLET | Freq: Every day | ORAL | Status: DC

## 2012-04-25 NOTE — Assessment & Plan Note (Signed)
Symptomatically stable on medical therapy. Continue current regimen. Encouraged her to continue her regular exercise. Followup arranged.

## 2012-04-25 NOTE — Patient Instructions (Addendum)

## 2012-04-25 NOTE — Progress Notes (Signed)
Clinical Summary Vanessa Fritz is a 67 y.o.female presenting for followup. She was seen in August 2013. She reports no progressive angina symptoms, states that she has been compliant with her medications. Bruising has been better after reducing aspirin dose. She states that she has joined a gym has been walking on the treadmill as well as using a stationary bicycle.  Recent lab work from January reviewed finding BUN 14, creatinine 1.0, potassium 4.9, AST 20, ALT 23, cholesterol 174, triglycerides 139, HDL 54, LDL 92.  Allergies  Allergen Reactions  . Sulfonamide Derivatives Swelling    REACTION: throat swelling    Current Outpatient Prescriptions  Medication Sig Dispense Refill  . acetaminophen (TYLENOL) 650 MG CR tablet Take 650 mg by mouth every 4 (four) hours as needed. For headaches      . alendronate (FOSAMAX) 70 MG tablet Take 70 mg by mouth every 7 (seven) days.       Marland Kitchen aspirin EC 81 MG tablet Take 81 mg by mouth daily.      . cholecalciferol (VITAMIN D) 1000 UNITS tablet Take 2,000 Units by mouth daily.      . clopidogrel (PLAVIX) 75 MG tablet Take 1 tablet (75 mg total) by mouth daily.  30 tablet  6  . metoprolol succinate (TOPROL-XL) 25 MG 24 hr tablet Take 1 tablet (25 mg total) by mouth daily.  30 tablet  6  . Multiple Vitamin (MULTIVITAMIN WITH MINERALS) TABS Take 1 tablet by mouth daily.      . nitroGLYCERIN (NITROSTAT) 0.4 MG SL tablet Place 0.4 mg under the tongue every 5 (five) minutes as needed. For chest pain       . simvastatin (ZOCOR) 20 MG tablet Take 1 tablet (20 mg total) by mouth at bedtime.  30 tablet  6  . vitamin C (ASCORBIC ACID) 500 MG tablet Take 500 mg by mouth daily.      . vitamin B-12 (CYANOCOBALAMIN) 1000 MCG tablet Take 1,000 mcg by mouth daily.       No current facility-administered medications for this visit.    Past Medical History  Diagnosis Date  . Coronary atherosclerosis of native coronary artery     Multivessel, LVEF 65%, DES circumflex  and LAD 8/11  . Hyperlipidemia   . Heart attack     Social History Ms. Dattilio reports that she quit smoking about 2 years ago. Her smoking use included Cigarettes. She has a .6 pack-year smoking history. She has never used smokeless tobacco. Ms. Chichester reports that she does not drink alcohol.  Review of Systems No palpitations. Stable appetite. No orthopnea or PND. Otherwise negative.  Physical Examination Filed Vitals:   04/25/12 1256  BP: 150/74  Pulse: 75   Filed Weights   04/25/12 1256  Weight: 115 lb (52.164 kg)    Normally nourished appearing woman in no acute distress.  HEENT: Conjunctiva and lids normal, oropharynx clear.  Neck: Supple, no elevated JVP or bruits.  Lungs: Clear auscultation, nonlabored.  Cardiac: Regular rate and rhythm, no S3 or pericardial rub.  Abdomen: Soft, nontender, bowel sounds present.  Extremities: No pitting edema, distal pulses 2+.  Skin: Warm and dry.    Problem List and Plan   CORONARY ATHEROSCLEROSIS NATIVE CORONARY ARTERY Symptomatically stable on medical therapy. Continue current regimen. Encouraged her to continue her regular exercise. Followup arranged.  ELEVATED BLOOD PRESSURE May be component of whitecoat hypertension. Home blood pressure checks have essentially been normal.  MIXED HYPERLIPIDEMIA Continue statin therapy, refill provided  for Zocor.    Jonelle Sidle, M.D., F.A.C.C.

## 2012-04-25 NOTE — Assessment & Plan Note (Signed)
May be component of whitecoat hypertension. Home blood pressure checks have essentially been normal.

## 2012-04-25 NOTE — Assessment & Plan Note (Signed)
Continue statin therapy, refill provided for Zocor.

## 2012-08-14 ENCOUNTER — Other Ambulatory Visit: Payer: Self-pay | Admitting: *Deleted

## 2012-08-14 MED ORDER — METOPROLOL SUCCINATE ER 25 MG PO TB24: 25.0000 mg | ORAL_TABLET | Freq: Every day | ORAL | Status: DC

## 2012-09-18 ENCOUNTER — Encounter: Payer: Self-pay | Admitting: Cardiology

## 2012-09-25 ENCOUNTER — Other Ambulatory Visit (HOSPITAL_COMMUNITY): Payer: Self-pay | Admitting: Nurse Practitioner

## 2012-09-25 DIAGNOSIS — Z139 Encounter for screening, unspecified: Secondary | ICD-10-CM

## 2012-10-30 ENCOUNTER — Ambulatory Visit (INDEPENDENT_AMBULATORY_CARE_PROVIDER_SITE_OTHER): Payer: Medicare Other | Admitting: Cardiology

## 2012-10-30 ENCOUNTER — Encounter: Payer: Self-pay | Admitting: Cardiology

## 2012-10-30 VITALS — BP 152/82 | HR 65 | Ht 59.0 in | Wt 113.8 lb

## 2012-10-30 DIAGNOSIS — R030 Elevated blood-pressure reading, without diagnosis of hypertension: Secondary | ICD-10-CM

## 2012-10-30 DIAGNOSIS — I2510 Atherosclerotic heart disease of native coronary artery without angina pectoris: Secondary | ICD-10-CM

## 2012-10-30 DIAGNOSIS — E782 Mixed hyperlipidemia: Secondary | ICD-10-CM

## 2012-10-30 NOTE — Progress Notes (Signed)
Clinical Summary Vanessa Fritz is a 67 y.o.female last seen in February. She is here with a friend. Doing well, no angina, occasionally feels palpitations when she is busy. No dizziness or syncope. Reports compliance with her medications.  Recent lab work in July showed BUN 22, creatinine 0.8, potassium 4.6, AST 12, ALT 20, cholesterol 161, triglycerides 97, HDL 64, LDL 78, hemoglobin 13.8, platelets 243.  ECG today shows normal sinus rhythm.   Allergies  Allergen Reactions  . Sulfonamide Derivatives Swelling    REACTION: throat swelling    Current Outpatient Prescriptions  Medication Sig Dispense Refill  . acetaminophen (TYLENOL) 650 MG CR tablet Take 650 mg by mouth every 4 (four) hours as needed. For headaches      . alendronate (FOSAMAX) 70 MG tablet Take 70 mg by mouth every 7 (seven) days.       Marland Kitchen aspirin EC 81 MG tablet Take 81 mg by mouth daily.      . cholecalciferol (VITAMIN D) 1000 UNITS tablet Take 2,000 Units by mouth daily.      . clopidogrel (PLAVIX) 75 MG tablet Take 1 tablet (75 mg total) by mouth daily.  30 tablet  6  . metoprolol succinate (TOPROL-XL) 25 MG 24 hr tablet Take 1 tablet (25 mg total) by mouth daily.  30 tablet  6  . Multiple Vitamin (MULTIVITAMIN WITH MINERALS) TABS Take 1 tablet by mouth daily.      . nitroGLYCERIN (NITROSTAT) 0.4 MG SL tablet Place 0.4 mg under the tongue every 5 (five) minutes as needed. For chest pain       . simvastatin (ZOCOR) 20 MG tablet Take 1 tablet (20 mg total) by mouth at bedtime.  30 tablet  6  . vitamin B-12 (CYANOCOBALAMIN) 1000 MCG tablet Take 1,000 mcg by mouth daily.      . vitamin C (ASCORBIC ACID) 500 MG tablet Take 500 mg by mouth daily.       No current facility-administered medications for this visit.    Past Medical History  Diagnosis Date  . Coronary atherosclerosis of native coronary artery     Multivessel, LVEF 65%, DES circumflex and LAD 8/11  . Hyperlipidemia   . Heart attack     Social  History Ms. Rashid reports that she quit smoking about 3 years ago. Her smoking use included Cigarettes. She has a .6 pack-year smoking history. She has never used smokeless tobacco. Ms. Bokhari reports that she does not drink alcohol.  Review of Systems Negative except as outlined.  Physical Examination Filed Vitals:   10/30/12 0845  BP: 152/82  Pulse: 65   Filed Weights   10/30/12 0845  Weight: 113 lb 12.8 oz (51.619 kg)    Normally nourished appearing woman, comfortable at rest.  HEENT: Conjunctiva and lids normal, oropharynx clear.  Neck: Supple, no elevated JVP or bruits.  Lungs: Clear auscultation, nonlabored.  Cardiac: Regular rate and rhythm, no S3 or pericardial rub.  Abdomen: Soft, nontender, bowel sounds present.  Extremities: No pitting edema, distal pulses 2+.  Skin: Warm and dry.    Problem List and Plan   CORONARY ATHEROSCLEROSIS NATIVE CORONARY ARTERY Symptomatically stable, doing well status post prior intervention to the circumflex and LAD in August 2011. She will stop Plavix, continue aspirin and remaining medical regimen. Encouraged her to continue walking for exercise. Followup arranged.  Mixed hyperlipidemia Lipid control good on Zocor.  ELEVATED BLOOD PRESSURE May be component of whitecoat hypertension. Home blood pressure checks have essentially been  normal.    Jonelle Sidle, M.D., F.A.C.C.

## 2012-10-30 NOTE — Assessment & Plan Note (Signed)
May be component of whitecoat hypertension. Home blood pressure checks have essentially been normal.

## 2012-10-30 NOTE — Assessment & Plan Note (Signed)
Lipid control good on Zocor.

## 2012-10-30 NOTE — Patient Instructions (Addendum)
Your physician recommends that you schedule a follow-up appointment in: 6 months with Dr. Diona Browner. You should receive a letter in 4 months. If you do not receive this letter by December 2014 call our office and schedule this appointment.   Stop taking Plavix when you finish your current supply.  Continue all other medications.

## 2012-10-30 NOTE — Assessment & Plan Note (Signed)
Symptomatically stable, doing well status post prior intervention to the circumflex and LAD in August 2011. She will stop Plavix, continue aspirin and remaining medical regimen. Encouraged her to continue walking for exercise. Followup arranged.

## 2012-10-31 ENCOUNTER — Ambulatory Visit (HOSPITAL_COMMUNITY)
Admission: RE | Admit: 2012-10-31 | Discharge: 2012-10-31 | Disposition: A | Discharge status: Home / Self Care | Payer: Medicare Other | Source: Ambulatory Visit | Attending: Nurse Practitioner | Admitting: Nurse Practitioner

## 2012-10-31 DIAGNOSIS — Z1231 Encounter for screening mammogram for malignant neoplasm of breast: Secondary | ICD-10-CM | POA: Insufficient documentation

## 2012-10-31 DIAGNOSIS — Z139 Encounter for screening, unspecified: Secondary | ICD-10-CM

## 2012-11-10 ENCOUNTER — Other Ambulatory Visit: Payer: Self-pay | Admitting: *Deleted

## 2012-11-10 MED ORDER — SIMVASTATIN 20 MG PO TABS: 20.0000 mg | ORAL_TABLET | Freq: Every day | ORAL | Status: DC

## 2013-03-23 ENCOUNTER — Telehealth: Payer: Self-pay | Admitting: Cardiology

## 2013-03-23 MED ORDER — METOPROLOL SUCCINATE ER 25 MG PO TB24: 25.0000 mg | ORAL_TABLET | Freq: Every day | ORAL | Status: DC

## 2013-03-23 NOTE — Telephone Encounter (Signed)
Refill done.  

## 2013-04-29 ENCOUNTER — Emergency Department (HOSPITAL_COMMUNITY)
Admission: EM | Admit: 2013-04-29 | Discharge: 2013-04-29 | Disposition: A | Discharge status: Home / Self Care | Payer: Medicare Other | Attending: Emergency Medicine | Admitting: Emergency Medicine

## 2013-04-29 ENCOUNTER — Encounter (HOSPITAL_COMMUNITY): Payer: Self-pay | Admitting: Emergency Medicine

## 2013-04-29 ENCOUNTER — Emergency Department (HOSPITAL_COMMUNITY): Payer: Medicare Other

## 2013-04-29 DIAGNOSIS — Z79899 Other long term (current) drug therapy: Secondary | ICD-10-CM | POA: Insufficient documentation

## 2013-04-29 DIAGNOSIS — J069 Acute upper respiratory infection, unspecified: Secondary | ICD-10-CM | POA: Insufficient documentation

## 2013-04-29 DIAGNOSIS — R1110 Vomiting, unspecified: Secondary | ICD-10-CM | POA: Insufficient documentation

## 2013-04-29 DIAGNOSIS — Z7982 Long term (current) use of aspirin: Secondary | ICD-10-CM | POA: Insufficient documentation

## 2013-04-29 DIAGNOSIS — I252 Old myocardial infarction: Secondary | ICD-10-CM | POA: Insufficient documentation

## 2013-04-29 DIAGNOSIS — R0789 Other chest pain: Secondary | ICD-10-CM | POA: Insufficient documentation

## 2013-04-29 DIAGNOSIS — I2510 Atherosclerotic heart disease of native coronary artery without angina pectoris: Secondary | ICD-10-CM | POA: Insufficient documentation

## 2013-04-29 DIAGNOSIS — E785 Hyperlipidemia, unspecified: Secondary | ICD-10-CM | POA: Insufficient documentation

## 2013-04-29 DIAGNOSIS — Z87891 Personal history of nicotine dependence: Secondary | ICD-10-CM | POA: Insufficient documentation

## 2013-04-29 DIAGNOSIS — Z7902 Long term (current) use of antithrombotics/antiplatelets: Secondary | ICD-10-CM | POA: Insufficient documentation

## 2013-04-29 LAB — CBC WITH DIFFERENTIAL/PLATELET
Basophils Absolute: 0 10*3/uL (ref 0.0–0.1)
Basophils Relative: 0 % (ref 0–1)
Eosinophils Absolute: 0.1 10*3/uL (ref 0.0–0.7)
Eosinophils Relative: 1 % (ref 0–5)
HCT: 35.5 % — ABNORMAL LOW (ref 36.0–46.0)
Hemoglobin: 12.4 g/dL (ref 12.0–15.0)
LYMPHS ABS: 1.9 10*3/uL (ref 0.7–4.0)
LYMPHS PCT: 15 % (ref 12–46)
MCH: 31.4 pg (ref 26.0–34.0)
MCHC: 34.9 g/dL (ref 30.0–36.0)
MCV: 89.9 fL (ref 78.0–100.0)
Monocytes Absolute: 1.4 10*3/uL — ABNORMAL HIGH (ref 0.1–1.0)
Monocytes Relative: 10 % (ref 3–12)
Neutro Abs: 9.7 10*3/uL — ABNORMAL HIGH (ref 1.7–7.7)
Neutrophils Relative %: 74 % (ref 43–77)
PLATELETS: 272 10*3/uL (ref 150–400)
RBC: 3.95 MIL/uL (ref 3.87–5.11)
RDW: 12.7 % (ref 11.5–15.5)
WBC: 13.1 10*3/uL — AB (ref 4.0–10.5)

## 2013-04-29 LAB — TROPONIN I: Troponin I: <0.3 ng/mL (ref <0.30)

## 2013-04-29 LAB — COMPREHENSIVE METABOLIC PANEL
ALK PHOS: 100 U/L (ref 39–117)
ALT: 11 U/L (ref 0–35)
AST: 13 U/L (ref 0–37)
Albumin: 3.7 g/dL (ref 3.5–5.2)
BUN: 17 mg/dL (ref 6–23)
CO2: 26 mEq/L (ref 19–32)
Calcium: 9.9 mg/dL (ref 8.4–10.5)
Chloride: 99 mEq/L (ref 96–112)
Creatinine, Ser: 0.81 mg/dL (ref 0.50–1.10)
GFR calc non Af Amer: 72 mL/min — ABNORMAL LOW (ref >90)
GFR, EST AFRICAN AMERICAN: 84 mL/min — AB (ref >90)
GLUCOSE: 107 mg/dL — AB (ref 70–99)
Potassium: 4.5 mEq/L (ref 3.7–5.3)
Sodium: 137 mEq/L (ref 137–147)
Total Bilirubin: 0.5 mg/dL (ref 0.3–1.2)
Total Protein: 7.5 g/dL (ref 6.0–8.3)

## 2013-04-29 MED ORDER — KETOROLAC TROMETHAMINE 30 MG/ML IJ SOLN
30.0000 mg | Freq: Once | INTRAMUSCULAR | Status: AC
  Administered 2013-04-29: 30 mg via INTRAVENOUS
  Filled 2013-04-29: qty 1

## 2013-04-29 MED ORDER — SODIUM CHLORIDE 0.9 % IV BOLUS (SEPSIS)
1000.0000 mL | Freq: Once | INTRAVENOUS | Status: AC
  Administered 2013-04-29: 1000 mL via INTRAVENOUS

## 2013-04-29 MED ORDER — HYDROCOD POLST-CHLORPHEN POLST 10-8 MG/5ML PO LQCR
5.0000 mL | Freq: Once | ORAL | Status: AC
  Administered 2013-04-29: 5 mL via ORAL
  Filled 2013-04-29: qty 5

## 2013-04-29 MED ORDER — HYDROCOD POLST-CHLORPHEN POLST 10-8 MG/5ML PO LQCR: 5.0000 mL | Freq: Two times a day (BID) | ORAL | Status: DC | PRN

## 2013-04-29 NOTE — ED Notes (Signed)
Pt dx with flu the other day by pcp. States is not better. C/o bad cough that is productive but unknown in color. pt c/o pain between shoulder blades that radiates around right breast since yesterday. Mm moist. C/o weakness. Denies sob.

## 2013-04-29 NOTE — ED Notes (Signed)
Please see EDP's assessment, seen prior to RN

## 2013-04-29 NOTE — ED Provider Notes (Signed)
CSN: [id number]     Arrival date & time 04/29/13  1544 History  This chart was scribed for Gerhard Munch, MD by Blanchard Kelch, ED Scribe. The patient was seen in room APA06/AP[facility]06. Patient's care was started at 4:12 PM.     Chief Complaint  Patient presents with  . Cough     The history is provided by the patient. No language interpreter was used.    HPI Comments: Vanessa Fritz is a 68 y.o. female with history of MI (2011) who presents to the Emergency Department complaining of constant, right sided chest pain that began yesterday. She states the pain radiates to her back under her right breast. She states that she is currently sick with a cough, vomiting, sore throat and fever that began a week ago and was clinically diagnosed with the flu two days later by her PCP, Dr. Gwenyth Allegra. She states that she last vomited yesterday and her last fever was four days ago but the cough is unchanged. She has been taking cough syrup with codeine with moderate relief of the cough. She denies any alleviating factors for her chest pain. She denies any leg swelling. She denies any other past pertinent medical history. She denies smoking or alcohol use.    Past Medical History  Diagnosis Date  . Coronary atherosclerosis of native coronary artery     Multivessel, LVEF 65%, DES circumflex and LAD 8/11  . Hyperlipidemia   . Heart attack    Past Surgical History  Procedure Laterality Date  . Hernia repair    . Cardiac catheterization    . Tubal ligation    . Colonoscopy  05/10/2011    Procedure: COLONOSCOPY;  Surgeon: Corbin Ade, MD;  Location: [facility];  Service: Endoscopy;  Laterality: N/A;  8:15 AM   Family History  Problem Relation Age of Onset  . Coronary artery disease Mother     History of MI and stroke  . Coronary artery disease Brother     CABG in his 23s  . Coronary artery disease Brother     Died of MI age 34   History  Substance Use Topics  . Smoking status: Former  Smoker -- 0.30 packs/day for 2 years    Types: Cigarettes    Quit date: 10/13/2009  . Smokeless tobacco: Never Used  . Alcohol Use: No   OB History   Grav Para Term Preterm Abortions TAB SAB Ect Mult Living                 Review of Systems  Constitutional:       Per HPI, otherwise negative  HENT:       Per HPI, otherwise negative  Respiratory:       Per HPI, otherwise negative  Cardiovascular:       Per HPI, otherwise negative  Gastrointestinal: Positive for vomiting.  Endocrine:       Negative aside from HPI  Genitourinary:       Neg aside from HPI   Musculoskeletal:       Per HPI, otherwise negative  Skin: Negative.   Neurological: Negative for syncope.  All other systems reviewed and are negative.      Allergies  Sulfonamide derivatives  Home Medications   Current Outpatient Rx  Name  Route  Sig  Dispense  Refill  . acetaminophen (TYLENOL) 650 MG CR tablet   Oral   Take 650 mg by mouth every 4 (four) hours as needed.  For headaches         . alendronate (FOSAMAX) 70 MG tablet   Oral   Take 70 mg by mouth every 7 (seven) days.          Marland Kitchen. aspirin EC 81 MG tablet   Oral   Take 81 mg by mouth daily.         . cholecalciferol (VITAMIN D) 1000 UNITS tablet   Oral   Take 2,000 Units by mouth daily.         . clopidogrel (PLAVIX) 75 MG tablet   Oral   Take 1 tablet (75 mg total) by mouth daily.   30 tablet   6   . metoprolol succinate (TOPROL-XL) 25 MG 24 hr tablet   Oral   Take 1 tablet (25 mg total) by mouth daily.   30 tablet   1   . Multiple Vitamin (MULTIVITAMIN WITH MINERALS) TABS   Oral   Take 1 tablet by mouth daily.         . nitroGLYCERIN (NITROSTAT) 0.4 MG SL tablet   Sublingual   Place 0.4 mg under the tongue every 5 (five) minutes as needed. For chest pain          . simvastatin (ZOCOR) 20 MG tablet   Oral   Take 1 tablet (20 mg total) by mouth at bedtime.   30 tablet   6   . vitamin B-12 (CYANOCOBALAMIN) 1000 MCG  tablet   Oral   Take 1,000 mcg by mouth daily.         . vitamin C (ASCORBIC ACID) 500 MG tablet   Oral   Take 500 mg by mouth daily.          Triage Vitals: BP 153/75  Pulse 108  Resp 16  Ht 4\' 11"  (1.499 m)  Wt 108 lb (48.988 kg)  BMI 21.80 kg/m2  SpO2 100%  Physical Exam  Nursing note and vitals reviewed. Constitutional: She is oriented to person, place, and time. She appears well-developed and well-nourished. No distress.  HENT:  Head: Normocephalic and atraumatic.  Eyes: Conjunctivae and EOM are normal.  Cardiovascular: Normal rate and regular rhythm.   Pulmonary/Chest: Effort normal and breath sounds normal. No stridor. No respiratory distress.  Abdominal: She exhibits no distension.  Musculoskeletal: She exhibits no edema.  Neurological: She is alert and oriented to person, place, and time. No cranial nerve deficit.  Skin: Skin is warm and dry.  Psychiatric: She has a normal mood and affect.    ED Course  Procedures (including critical care time)    COORDINATION OF CARE: 4:19 PM -Ordered chest x-ray and EKG. Patient verbalizes understanding and agrees with treatment plan.  5:37 PM- Patient states that she is improved. Discussed discharge plan with patient, which includes rest and fluids. Recommend follow up with PCP. Patient verbalizes understand and agrees with treatment plan.    Labs Review Labs Reviewed  CBC WITH DIFFERENTIAL - Abnormal; Notable for the following:    WBC 13.1 (*)    HCT 35.5 (*)    Neutro Abs 9.7 (*)    Monocytes Absolute 1.4 (*)    All other components within normal limits  COMPREHENSIVE METABOLIC PANEL - Abnormal; Notable for the following:    Glucose, Bld 107 (*)    GFR calc non Af Amer 72 (*)    GFR calc Af Amer 84 (*)    All other components within normal limits  TROPONIN I   Imaging Review Dg  Chest 2 View  04/29/2013   CLINICAL DATA:  Shortness of breath.  EXAM: CHEST  2 VIEW  COMPARISON:  PA and lateral chest  10/26/2009.  FINDINGS: The lungs are clear. Heart size is normal. No pneumothorax or pleural effusion. No focal bony abnormality.  IMPRESSION: No acute disease.   Electronically Signed   By: Drusilla Kanner M.D.   On: 04/29/2013 16:27    EKG Interpretation    Date/Time:  Sunday April 29 2013 16:09:30 EST Ventricular Rate:  99 PR Interval:  116 QRS Duration: 68 QT Interval:  324 QTC Calculation: 415 R Axis:   77 Text Interpretation:  Normal sinus rhythm Nonspecific ST abnormality Abnormal ECG When compared with ECG of 28-Oct-2009 06:51, Non-specific change in ST segment in Inferior leads T wave amplitude has decreased in Anterior leads T wave inversion no longer evident in Lateral leads Sinus rhythm ST-t wave abnormality lateral lead t wave inversions have resolved. Abnormal ekg Confirmed by Gerhard Munch  MD [phone]) on 04/29/2013 6:09:06 PM            MDM    I personally performed the services described in this documentation, which was scribed in my presence. The recorded information has been reviewed and is accurate.   Patient presents with one week of upper respiratory complaints.  Patient does have right-sided chest pain, though this seems most prominent with coughing. Given the duration of pain to the negative troponin likely, accurately complexity of ongoing coronary ischemia.  No patient is here with symptomatic therapy.  Patient's labs are largely reassuring, consistent with ongoing viral process. No evidence of pneumonia, and absent fever, with improvement in her condition, low suspicion for bacteremia or sepsis. The patient improved, was discharged in stable condition to follow up with primary care.   Gerhard Munch, MD 04/29/13 [phone]

## 2013-04-29 NOTE — Discharge Instructions (Signed)
As discussed, it is important to have plenty of rest, drink plenty fluids, and follow up with her primary care physician.   Return here for concerning changes in your condition.

## 2013-05-04 ENCOUNTER — Encounter: Payer: Self-pay | Admitting: Cardiology

## 2013-05-04 ENCOUNTER — Ambulatory Visit (INDEPENDENT_AMBULATORY_CARE_PROVIDER_SITE_OTHER): Payer: Medicare Other | Admitting: Cardiology

## 2013-05-04 VITALS — BP 136/77 | HR 85 | Ht 59.0 in | Wt 111.8 lb

## 2013-05-04 DIAGNOSIS — E782 Mixed hyperlipidemia: Secondary | ICD-10-CM

## 2013-05-04 DIAGNOSIS — I2510 Atherosclerotic heart disease of native coronary artery without angina pectoris: Secondary | ICD-10-CM

## 2013-05-04 MED ORDER — METOPROLOL SUCCINATE ER 25 MG PO TB24: 25.0000 mg | ORAL_TABLET | Freq: Every day | ORAL | Status: DC

## 2013-05-04 MED ORDER — SIMVASTATIN 20 MG PO TABS: 20.0000 mg | ORAL_TABLET | Freq: Every day | ORAL | Status: DC

## 2013-05-04 NOTE — Patient Instructions (Signed)

## 2013-05-04 NOTE — Assessment & Plan Note (Signed)
Symptomatically stable from a cardiac perspective. Continue medical therapy and observation. Recent ECG and cardiac enzymes reviewed.

## 2013-05-04 NOTE — Progress Notes (Signed)
Clinical Summary Ms. Blasingame is a 68 y.o.female last seen in August 2014. Record review finds recent ER visit 2 to prolonged right-sided chest pain associated with cough, emesis, and sore throat. Chest x-ray demonstrated no infiltrates, and she was diagnosed with probable viral URI. ECG reviewed finding sinus rhythm with nonspecific ST changes.  Recent lab work from this February showed hemoglobin 12.4, platelets 272, potassium 4.5, BUN 17, creatinine 0.8, normal AST and ALT, normal troponin I.  She is here with her daughter today. States her cough is getting better. She denies any angina. Has been compliant with her cardiac medications.  Allergies  Allergen Reactions  . Sulfonamide Derivatives Swelling    REACTION: throat swelling    Current Outpatient Prescriptions  Medication Sig Dispense Refill  . acetaminophen (TYLENOL) 650 MG CR tablet Take 650 mg by mouth every 4 (four) hours as needed. For headaches      . alendronate (FOSAMAX) 70 MG tablet Take 70 mg by mouth once a week.       Marland Kitchen aspirin EC 81 MG tablet Take 81 mg by mouth daily.      . chlorpheniramine-HYDROcodone (TUSSIONEX) 10-8 MG/5ML LQCR Take 5 mLs by mouth every 12 (twelve) hours as needed for cough.  115 mL  0  . cholecalciferol (VITAMIN D) 1000 UNITS tablet Take 2,000 Units by mouth daily.      . metoprolol succinate (TOPROL-XL) 25 MG 24 hr tablet Take 1 tablet (25 mg total) by mouth daily.  30 tablet  1  . Multiple Vitamin (MULTIVITAMIN WITH MINERALS) TABS Take 1 tablet by mouth daily.      . nitroGLYCERIN (NITROSTAT) 0.4 MG SL tablet Place 0.4 mg under the tongue every 5 (five) minutes as needed. For chest pain       . simvastatin (ZOCOR) 20 MG tablet Take 1 tablet (20 mg total) by mouth at bedtime.  30 tablet  6  . vitamin B-12 (CYANOCOBALAMIN) 1000 MCG tablet Take 1,000 mcg by mouth daily.      . vitamin C (ASCORBIC ACID) 500 MG tablet Take 500 mg by mouth daily.       No current facility-administered  medications for this visit.    Past Medical History  Diagnosis Date  . Coronary atherosclerosis of native coronary artery     Multivessel, LVEF 65%, DES circumflex and LAD 8/11  . Hyperlipidemia   . Heart attack     Social History Ms. Prescott reports that she quit smoking about 3 years ago. Her smoking use included Cigarettes. She has a .6 pack-year smoking history. She has never used smokeless tobacco. Ms. Schlueter reports that she does not drink alcohol.  Review of Systems No fevers or chills. Stable appetite. Negative except as outlined above.  Physical Examination Filed Vitals:   05/04/13 1402  BP: 136/77  Pulse: 85   Filed Weights   05/04/13 1402  Weight: 111 lb 12.8 oz (50.712 kg)    Normally nourished appearing woman, comfortable at rest.  HEENT: Conjunctiva and lids normal, oropharynx clear.  Neck: Supple, no elevated JVP or bruits.  Lungs: Clear auscultation, nonlabored.  Cardiac: Regular rate and rhythm, no S3 or pericardial rub.  Abdomen: Soft, nontender, bowel sounds present.  Extremities: No pitting edema, distal pulses 2+.  Skin: Warm and dry.    Problem List and Plan   CORONARY ATHEROSCLEROSIS NATIVE CORONARY ARTERY Symptomatically stable from a cardiac perspective. Continue medical therapy and observation. Recent ECG and cardiac enzymes reviewed.  Mixed hyperlipidemia  Continues on Zocor , refill provided.    Jonelle SidleSamuel G. McDowell, M.D., F.A.C.C.

## 2013-05-04 NOTE — Assessment & Plan Note (Signed)
Continues on Zocor , refill provided.

## 2013-10-08 ENCOUNTER — Other Ambulatory Visit (HOSPITAL_COMMUNITY): Payer: Self-pay | Admitting: Family Medicine

## 2013-10-08 DIAGNOSIS — Z139 Encounter for screening, unspecified: Secondary | ICD-10-CM

## 2013-10-22 ENCOUNTER — Encounter: Payer: Self-pay | Admitting: Cardiology

## 2013-11-08 ENCOUNTER — Encounter: Payer: Self-pay | Admitting: *Deleted

## 2013-11-08 ENCOUNTER — Encounter: Payer: Self-pay | Admitting: Cardiology

## 2013-11-08 ENCOUNTER — Telehealth: Payer: Self-pay | Admitting: Cardiology

## 2013-11-08 ENCOUNTER — Ambulatory Visit (INDEPENDENT_AMBULATORY_CARE_PROVIDER_SITE_OTHER): Payer: Medicare Other | Admitting: Cardiology

## 2013-11-08 VITALS — BP 152/90 | HR 70 | Ht 59.0 in | Wt 108.0 lb

## 2013-11-08 DIAGNOSIS — I2510 Atherosclerotic heart disease of native coronary artery without angina pectoris: Secondary | ICD-10-CM

## 2013-11-08 DIAGNOSIS — E782 Mixed hyperlipidemia: Secondary | ICD-10-CM

## 2013-11-08 DIAGNOSIS — I209 Angina pectoris, unspecified: Secondary | ICD-10-CM

## 2013-11-08 DIAGNOSIS — R0602 Shortness of breath: Secondary | ICD-10-CM

## 2013-11-08 DIAGNOSIS — I25119 Atherosclerotic heart disease of native coronary artery with unspecified angina pectoris: Secondary | ICD-10-CM

## 2013-11-08 MED ORDER — SIMVASTATIN 20 MG PO TABS: 20.0000 mg | ORAL_TABLET | Freq: Every day | ORAL | Status: DC

## 2013-11-08 MED ORDER — METOPROLOL SUCCINATE ER 25 MG PO TB24: 25.0000 mg | ORAL_TABLET | Freq: Every day | ORAL | Status: DC

## 2013-11-08 NOTE — Telephone Encounter (Signed)
Exercise Cardiolite holding am dose of toprol dx:CAD & SOB (PLEASE SCHEDULE ON 11/12/13 IF POSSIBLE Scheduled at [facility] on Sept 4th arrive at 9:15 am

## 2013-11-08 NOTE — Assessment & Plan Note (Signed)
Plan to proceed with an exercise Cardiolite, hold beta blocker on the morning of the test. She has had more shortness of breath recently with activity. Status post DES to the circumflex and LAD in August 2011.

## 2013-11-08 NOTE — Progress Notes (Signed)
Clinical Summary Vanessa Fritz is a 68 y.o.female last seen in February. Overall she has done well, although during the hot summer she has noticed more shortness of breath with activity, particularly when she walks uphill. No definite angina symptoms.  She reports compliance with her medications.  Recent lab work showed hemoglobin 14.0, platelets 238, BUN 12, creatinine 0.8, potassium 4.3, normal LFTs, cholesterol 159, triglycerides 95, HDL 68, LDL 72.  She has not had followup ischemic testing since her intervention in 2011.   Allergies  Allergen Reactions  . Sulfonamide Derivatives Swelling    REACTION: throat swelling    Current Outpatient Prescriptions  Medication Sig Dispense Refill  . acetaminophen (TYLENOL) 650 MG CR tablet Take 650 mg by mouth every 4 (four) hours as needed. For headaches      . alendronate (FOSAMAX) 70 MG tablet Take 70 mg by mouth once a week.       Marland Kitchen aspirin EC 81 MG tablet Take 81 mg by mouth daily.      . chlorpheniramine-HYDROcodone (TUSSIONEX) 10-8 MG/5ML LQCR Take 5 mLs by mouth every 12 (twelve) hours as needed for cough.  115 mL  0  . cholecalciferol (VITAMIN D) 1000 UNITS tablet Take 2,000 Units by mouth daily.      . metoprolol succinate (TOPROL-XL) 25 MG 24 hr tablet Take 1 tablet (25 mg total) by mouth daily.  90 tablet  3  . Multiple Vitamin (MULTIVITAMIN WITH MINERALS) TABS Take 1 tablet by mouth daily.      . nitroGLYCERIN (NITROSTAT) 0.4 MG SL tablet Place 0.4 mg under the tongue every 5 (five) minutes as needed. For chest pain       . simvastatin (ZOCOR) 20 MG tablet Take 1 tablet (20 mg total) by mouth at bedtime.  90 tablet  3  . vitamin B-12 (CYANOCOBALAMIN) 1000 MCG tablet Take 1,000 mcg by mouth daily.      . vitamin C (ASCORBIC ACID) 500 MG tablet Take 500 mg by mouth daily.       No current facility-administered medications for this visit.    Past Medical History  Diagnosis Date  . Coronary atherosclerosis of native coronary  artery     Multivessel, LVEF 65%, DES circumflex and LAD 8/11  . Hyperlipidemia   . Heart attack     Social History Ms. Dechaine reports that she quit smoking about 4 years ago. Her smoking use included Cigarettes. She has a .6 pack-year smoking history. She has never used smokeless tobacco. Ms. Merrihew reports that she does not drink alcohol.  Review of Systems No palpitations or syncope. No bleeding episodes. No hospitalizations. Other systems reviewed and negative.  Physical Examination Filed Vitals:   11/08/13 1118  BP: 152/90  Pulse: 70   Filed Weights   11/08/13 1118  Weight: 108 lb (48.988 kg)    Normally nourished appearing woman, comfortable at rest.  HEENT: Conjunctiva and lids normal, oropharynx clear.  Neck: Supple, no elevated JVP or bruits.  Lungs: Clear auscultation, nonlabored.  Cardiac: Regular rate and rhythm, no S3 or pericardial rub.  Abdomen: Soft, nontender, bowel sounds present.  Extremities: No pitting edema, distal pulses 2+.  Skin: Warm and dry.    Problem List and Plan   CORONARY ATHEROSCLEROSIS NATIVE CORONARY ARTERY Plan to proceed with an exercise Cardiolite, hold beta blocker on the morning of the test. She has had more shortness of breath recently with activity. Status post DES to the circumflex and LAD in August 2011.  Mixed hyperlipidemia LDL 72 on statin therapy.    Jonelle Sidle, M.D., F.A.C.C.

## 2013-11-08 NOTE — Assessment & Plan Note (Signed)
LDL 72 on statin therapy.

## 2013-11-08 NOTE — Patient Instructions (Signed)
Your physician recommends that you schedule a follow-up appointment in: 6 months. You will receive a reminder letter in the mail in about 4 months reminding you to call and schedule your appointment. If you don't receive this letter, please contact our office. Your physician recommends that you continue on your current medications as directed. Please refer to the Current Medication list given to you today. Your physician has requested that you have en exercise stress myoview. For further information please visit [email or web address]. Please follow instruction sheet, as given.  

## 2013-11-09 NOTE — Telephone Encounter (Signed)
Approved and in epic °

## 2013-11-12 ENCOUNTER — Ambulatory Visit (HOSPITAL_COMMUNITY)
Admission: RE | Admit: 2013-11-12 | Discharge: 2013-11-12 | Disposition: A | Discharge status: Home / Self Care | Payer: Medicare Other | Source: Ambulatory Visit | Attending: Family Medicine | Admitting: Family Medicine

## 2013-11-12 DIAGNOSIS — Z1231 Encounter for screening mammogram for malignant neoplasm of breast: Secondary | ICD-10-CM | POA: Insufficient documentation

## 2013-11-12 DIAGNOSIS — Z139 Encounter for screening, unspecified: Secondary | ICD-10-CM

## 2013-11-16 ENCOUNTER — Encounter (HOSPITAL_COMMUNITY)
Admission: RE | Admit: 2013-11-16 | Discharge: 2013-11-16 | Disposition: A | Discharge status: Home / Self Care | Payer: Medicare Other | Source: Ambulatory Visit | Attending: Cardiology | Admitting: Cardiology

## 2013-11-16 ENCOUNTER — Encounter (HOSPITAL_COMMUNITY): Payer: Self-pay

## 2013-11-16 ENCOUNTER — Ambulatory Visit (HOSPITAL_COMMUNITY)
Admission: RE | Admit: 2013-11-16 | Discharge: 2013-11-16 | Disposition: A | Discharge status: Home / Self Care | Payer: Medicare Other | Source: Ambulatory Visit | Attending: Cardiology | Admitting: Cardiology

## 2013-11-16 DIAGNOSIS — R0602 Shortness of breath: Secondary | ICD-10-CM

## 2013-11-16 DIAGNOSIS — I2510 Atherosclerotic heart disease of native coronary artery without angina pectoris: Secondary | ICD-10-CM | POA: Diagnosis not present

## 2013-11-16 DIAGNOSIS — R0609 Other forms of dyspnea: Secondary | ICD-10-CM | POA: Insufficient documentation

## 2013-11-16 DIAGNOSIS — R0989 Other specified symptoms and signs involving the circulatory and respiratory systems: Principal | ICD-10-CM | POA: Insufficient documentation

## 2013-11-16 MED ORDER — SODIUM CHLORIDE 0.9 % IJ SOLN
10.0000 mL | INTRAMUSCULAR | Status: DC | PRN
  Administered 2013-11-16: 10 mL via INTRAVENOUS

## 2013-11-16 MED ORDER — SODIUM CHLORIDE 0.9 % IJ SOLN
INTRAMUSCULAR | Status: AC
  Administered 2013-11-16: 10 mL via INTRAVENOUS
  Filled 2013-11-16: qty 10

## 2013-11-16 MED ORDER — TECHNETIUM TC 99M SESTAMIBI GENERIC - CARDIOLITE
10.0000 | Freq: Once | INTRAVENOUS | Status: AC | PRN
  Administered 2013-11-16: 10 via INTRAVENOUS

## 2013-11-16 MED ORDER — SODIUM CHLORIDE 0.9 % IJ SOLN
INTRAMUSCULAR | Status: DC
Start: 2013-11-16 — End: 2013-11-22
  Filled 2013-11-16: qty 36

## 2013-11-16 MED ORDER — REGADENOSON 0.4 MG/5ML IV SOLN
INTRAVENOUS | Status: AC
  Filled 2013-11-16: qty 5

## 2013-11-16 MED ORDER — TECHNETIUM TC 99M SESTAMIBI - CARDIOLITE
30.0000 | Freq: Once | INTRAVENOUS | Status: AC | PRN
  Administered 2013-11-16: 30 via INTRAVENOUS

## 2013-11-16 NOTE — Progress Notes (Signed)
Stress Lab Nurses Notes - [facility]  LASHANDA STORLIE 11/16/2013 Reason for doing test: CAD and Dyspnea Type of test: Stress Cardiolite Nurse performing test: Parke Poisson, RN Nuclear Medicine Tech: Marcella Dubs Echo Tech: Not Applicable MD performing test: Branch/K.Lyman Bishop NP Family MD: Gwenyth Allegra DO Test explained and consent signed: Yes.   IV started: Saline lock flushed, No redness or edema and Saline lock started in radiology Symptoms: SOB & fatigue in legs Treatment/Intervention: None Reason test stopped: fatigue After recovery IV was: Discontinued via X-ray tech and No redness or edema Patient to return to Nuc. Med at :12:50 Patient discharged: Home Patient's Condition upon discharge was: stable Comments:During test peak BP 142/72 & HR 126.  Recovery BP 133/64 & HR 77.  Symptoms resolved in recovery.  Erskine Speed T

## 2013-11-20 ENCOUNTER — Telehealth: Payer: Self-pay | Admitting: *Deleted

## 2013-11-20 NOTE — Telephone Encounter (Signed)
Patient informed. 

## 2013-11-20 NOTE — Telephone Encounter (Signed)
Message copied by Eustace Moore on Tue Nov 20, 2013  2:22 PM ------      Message from: Jonelle Sidle      Created: Fri Nov 16, 2013  3:54 PM       Reviewed report. Please let her know that the test did not suggest any ischemia, arguing against any progressive coronary blockages or stent restenosis. Would continue medical therapy. ------

## 2014-05-09 ENCOUNTER — Encounter: Payer: Self-pay | Admitting: Cardiology

## 2014-05-09 ENCOUNTER — Other Ambulatory Visit: Payer: Self-pay | Admitting: *Deleted

## 2014-05-09 ENCOUNTER — Ambulatory Visit (INDEPENDENT_AMBULATORY_CARE_PROVIDER_SITE_OTHER): Payer: Medicare Other | Admitting: Cardiology

## 2014-05-09 VITALS — BP 154/70 | HR 76 | Ht 59.0 in | Wt 113.0 lb

## 2014-05-09 DIAGNOSIS — E782 Mixed hyperlipidemia: Secondary | ICD-10-CM

## 2014-05-09 DIAGNOSIS — I2510 Atherosclerotic heart disease of native coronary artery without angina pectoris: Secondary | ICD-10-CM

## 2014-05-09 DIAGNOSIS — I10 Essential (primary) hypertension: Secondary | ICD-10-CM

## 2014-05-09 MED ORDER — SIMVASTATIN 20 MG PO TABS: 20.0000 mg | ORAL_TABLET | Freq: Every day | ORAL | Status: DC

## 2014-05-09 MED ORDER — NITROGLYCERIN 0.4 MG SL SUBL: 0.4000 mg | SUBLINGUAL_TABLET | SUBLINGUAL | Status: DC | PRN

## 2014-05-09 MED ORDER — METOPROLOL SUCCINATE ER 25 MG PO TB24: 25.0000 mg | ORAL_TABLET | Freq: Every day | ORAL | Status: DC

## 2014-05-09 NOTE — Patient Instructions (Addendum)

## 2014-05-09 NOTE — Progress Notes (Signed)
Cardiology Office Note  Date: 05/09/2014   ID: Vanessa Fritz, DOB 1945/04/25, MRN [id number]  PCP: Clovis PuVan Horn, Arturo MortonJill E, DO  Primary Cardiologist: Nona DellSamuel McDowell, MD   Chief Complaint  Patient presents with  . Coronary Artery Disease  . Hypertension    History of Present Illness: Vanessa Fritz is a 69 y.o. female last seen in August 2015. She presents for a routine visit today. Has been doing well without any significant angina, no dyspnea on exertion beyond NYHA class II with typical ADLs. She has not been as active over the winter, but typically enjoys getting outdoors to walk and also work in her yard. 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  Follow-up tracing is noted below, no significant changes. I also reviewed her most recent lab work.  She reports no changes in her medications, no nitroglycerin use, she needs a refill for a fresh bottle.   Past Medical History  Diagnosis Date  . Coronary atherosclerosis of native coronary artery     Multivessel, LVEF 65%, DES circumflex and LAD 8/11  . Hyperlipidemia   . Heart attack      Current Outpatient Prescriptions  Medication Sig Dispense Refill  . acetaminophen (TYLENOL) 650 MG CR tablet Take 650 mg by mouth every 4 (four) hours as needed. For headaches    . alendronate (FOSAMAX) 70 MG tablet Take 70 mg by mouth once a week.     Marland Kitchen. aspirin EC 81 MG tablet Take 81 mg by mouth daily.    . cholecalciferol (VITAMIN D) 1000 UNITS tablet Take 2,000 Units by mouth daily.    . metoprolol succinate (TOPROL-XL) 25 MG 24 hr tablet Take 1 tablet (25 mg total) by mouth daily. 90 tablet 3  . Multiple Vitamin (MULTIVITAMIN WITH MINERALS) TABS Take 1 tablet by mouth daily.    . nitroGLYCERIN (NITROSTAT) 0.4 MG SL tablet Place 0.4 mg under the tongue every 5 (five) minutes as needed. For chest pain     . simvastatin (ZOCOR) 20 MG tablet Take 1 tablet (20 mg total) by mouth at bedtime. 90 tablet 3  . vitamin B-12 (CYANOCOBALAMIN) 1000  MCG tablet Take 1,000 mcg by mouth daily.    . vitamin C (ASCORBIC ACID) 500 MG tablet Take 500 mg by mouth daily.     No current facility-administered medications for this visit.    Allergies:  Sulfonamide derivatives   Social History: The patient  reports that she quit smoking about 4 years ago. Her smoking use included Cigarettes. She has a .6 pack-year smoking history. She has never used smokeless tobacco. She reports that she does not drink alcohol or use illicit drugs.   ROS:  Please see the history of present illness. Otherwise, complete review of systems is positive for none.  All other systems are reviewed and negative.    Physical Exam: VS:  BP 154/70 mmHg  Pulse 76  Ht 4\' 11"  (1.499 m)  Wt 113 lb (51.256 kg)  BMI 22.81 kg/m2  SpO2 99%, BMI Body mass index is 22.81 kg/(m^2).  Wt Readings from Last 3 Encounters:  05/09/14 113 lb (51.256 kg)  11/08/13 108 lb (48.988 kg)  05/04/13 111 lb 12.8 oz (50.712 kg)     General: Patient appears comfortable at rest. HEENT: Conjunctiva and lids normal, oropharynx clear with moist mucosa. Neck: Supple, no elevated JVP or carotid bruits, no thyromegaly. Lungs: Clear to auscultation, nonlabored breathing at rest. Cardiac: Regular rate and rhythm, no S3 or significant  systolic murmur, no pericardial rub. Abdomen: Soft, nontender, no hepatomegaly, bowel sounds present, no guarding or rebound. Extremities: No pitting edema, distal pulses 2+. Skin: Warm and dry. Musculoskeletal: No kyphosis. Neuropsychiatric: Alert and oriented x3, affect grossly appropriate.   ECG: ECG is ordered today and reviewed showing sinus rhythm with left atrial enlargement.   Recent Labwork:  Recent lab work per primary care showed hemoglobin 13.9, platelets 269, BUN 14, creatinine 0.9, potassium 4.7, AST 23, ALT 35, cholesterol 181, triglycerides 144, HDL 67, LDL 85.  Other Studies Reviewed Today:  Exercise Cardiolite 11/16/2013: EXAM: MYOCARDIAL  IMAGING WITH SPECT (REST AND EXERCISE)  GATED LEFT VENTRICULAR WALL MOTION STUDY  LEFT VENTRICULAR EJECTION FRACTION  TECHNIQUE: Standard myocardial SPECT imaging was performed after resting intravenous injection of 10 mCi Tc-60m sestamibi. Subsequently, exercise tolerance test was performed by the patient under the supervision of the Cardiology staff. At peak-stress, 30 mCi Tc-26m sestamibi was injected intravenously and standard myocardial SPECT imaging was performed. Quantitative gated imaging was also performed to evaluate left ventricular wall motion, and estimate left ventricular ejection fraction.  COMPARISON: None.  FINDINGS: Exercise stress  Baseline EKG showed normal sinus rhythm. The patient was stressed according the Bruce protocol for 7 min and 30 seconds, achieving a work level of 8.50 Mets. The resting heart rate is 64 beats per min rose to 127 beats per min, representing 84% of the maximal age predicted heart rate. The resting blood pressure of 119/57 increased to a maximum of 142/72. The test was stopped due to ST depressions, the patient did not experience any chest pain.  Exercise EKG showed nonspecific upsloping ST depressions in the inferior leads, borderline primarily upsloping ST depressions in the lateral precordial leads. Overall findings not specific for ischemia. There were no significant arrhythmias.  Perfusion: No decreased activity in the left ventricle on stress imaging to suggest reversible ischemia or infarction.  Wall Motion: Hyperdynamic left ventricular wall motion. No left ventricular dilation.  Left Ventricular Ejection Fraction: Reported 93 %, elevated value likely due to processing issue  End diastolic volume 33 ml  End systolic volume 2 ml  IMPRESSION: 1. No reversible ischemia or infarction.  2. Normal left ventricular wall motion.  3. Left ventricular ejection fraction 93%, elevated value likely due to  processing issue  4. Low-risk stress test findings*.  Assessment and Plan:  1. CAD status post DES to the circumflex and LAD in 2011. She is symptomatically stable on medical therapy, ECG also stable. Continue observation. Encouraged regular exercise plan.  2. Hyperlipidemia, generally well controlled on Zocor, recent LDL 85 and HDL 67.  3. Essential hypertension, blood pressure is elevated today. She continues on Toprol-XL. Watch sodium in the diet, try and get back to regular walking regimen. Keep follow-up with primary care as additional agent may eventually need to be considered, possibly ACE inhibitor or ARB.  Current medicines are reviewed at length with the patient today.  The patient does not have concerns regarding medicines.    Orders Placed This Encounter  Procedures  . EKG 12-Lead    Disposition: FU with me in 6 months.   Signed, Jonelle Sidle, MD, [facility] 05/09/2014 1:19 PM    [facility] at [facility] [address] [place], [place], [place] [address] Phone: [phone]; Fax: [phone]

## 2014-10-15 ENCOUNTER — Other Ambulatory Visit (HOSPITAL_COMMUNITY): Payer: Self-pay | Admitting: Nurse Practitioner

## 2014-10-15 DIAGNOSIS — Z1231 Encounter for screening mammogram for malignant neoplasm of breast: Secondary | ICD-10-CM

## 2014-11-13 ENCOUNTER — Encounter: Payer: Self-pay | Admitting: Cardiology

## 2014-11-13 ENCOUNTER — Ambulatory Visit (INDEPENDENT_AMBULATORY_CARE_PROVIDER_SITE_OTHER): Payer: Medicare Other | Admitting: Cardiology

## 2014-11-13 VITALS — BP 118/70 | HR 66 | Ht 59.0 in | Wt 109.0 lb

## 2014-11-13 DIAGNOSIS — I2510 Atherosclerotic heart disease of native coronary artery without angina pectoris: Secondary | ICD-10-CM

## 2014-11-13 DIAGNOSIS — E782 Mixed hyperlipidemia: Secondary | ICD-10-CM

## 2014-11-13 MED ORDER — NITROGLYCERIN 0.4 MG SL SUBL: 0.4000 mg | SUBLINGUAL_TABLET | SUBLINGUAL | Status: DC | PRN

## 2014-11-13 NOTE — Patient Instructions (Addendum)
Your physician recommends that you continue on your current medications as directed. Please refer to the Current Medication list given to you today. Your physician recommends that you schedule a follow-up appointment in: 6 months. You will receive a reminder letter in the mail in about 4 months reminding you to call and schedule your appointment. If you don't receive this letter, please contact our office. 

## 2014-11-13 NOTE — Progress Notes (Signed)
Cardiology Office Note  Date: 11/13/2014   ID: Jossilyn, Benda 1946/02/07, MRN [id number]  PCP: Clovis Pu, Arturo Morton, DO  Primary Cardiologist: Nona Dell, MD   Chief Complaint  Patient presents with  . Coronary Artery Disease    History of Present Illness: Vanessa Fritz is a 69 y.o. female last seen in February. She is here today for a routine follow-up visit. Continues to do very well, no active angina symptoms. She does have an old bottle of nitroglycerin and needs a refill. 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  Exercise Cardiolite in September 2015 was negative for ischemia, overall low risk. She continues on medical therapy which is reviewed below.  Lipids have been well controlled on statin therapy, lab work reviewed.   Past Medical History  Diagnosis Date  . Coronary atherosclerosis of native coronary artery     Multivessel, LVEF 65%, DES circumflex and LAD 8/11  . Hyperlipidemia   . Heart attack     Current Outpatient Prescriptions  Medication Sig Dispense Refill  . acetaminophen (TYLENOL) 650 MG CR tablet Take 650 mg by mouth every 4 (four) hours as needed. For headaches    . alendronate (FOSAMAX) 70 MG tablet Take 70 mg by mouth once a week.     Marland Kitchen aspirin EC 81 MG tablet Take 81 mg by mouth daily.    . cholecalciferol (VITAMIN D) 1000 UNITS tablet Take 2,000 Units by mouth daily.    . metoprolol succinate (TOPROL-XL) 25 MG 24 hr tablet Take 1 tablet (25 mg total) by mouth daily. 90 tablet 3  . Multiple Vitamin (MULTIVITAMIN WITH MINERALS) TABS Take 1 tablet by mouth daily.    . nitroGLYCERIN (NITROSTAT) 0.4 MG SL tablet Place 1 tablet (0.4 mg total) under the tongue every 5 (five) minutes x 3 doses as needed. For chest pain 25 tablet 3  . simvastatin (ZOCOR) 20 MG tablet Take 1 tablet (20 mg total) by mouth at bedtime. 90 tablet 3  . vitamin B-12 (CYANOCOBALAMIN) 1000 MCG tablet Take 1,000 mcg by  mouth daily.    . vitamin C (ASCORBIC ACID) 500 MG tablet Take 500 mg by mouth daily.     No current facility-administered medications for this visit.    Allergies:  Sulfonamide derivatives   Social History: The patient  reports that she quit smoking about 5 years ago. Her smoking use included Cigarettes. She has a .6 pack-year smoking history. She has never used smokeless tobacco. She reports that she does not drink alcohol or use illicit drugs.   ROS:  Please see the history of present illness. Otherwise, complete review of systems is positive for none.  All other systems are reviewed and negative.   Physical Exam: VS:  BP 118/70 mmHg  Pulse 66  Ht  (1.499 m)  Wt 109 lb (49.442 kg)  BMI 22.00 kg/m2  SpO2 98%, BMI Body mass index is 22 kg/(m^2).  Wt Readings from Last 3 Encounters:  11/13/14 109 lb (49.442 kg)  05/09/14 113 lb (51.256 kg)  11/08/13 108 lb (48.988 kg)     General: Patient appears comfortable at rest. HEENT: Conjunctiva and lids normal, oropharynx clear. Neck: Supple, no elevated JVP or carotid bruits, no thyromegaly. Lungs: Clear to auscultation, nonlabored breathing at rest. Cardiac: Regular rate and rhythm, no S3 or significant systolic murmur, no pericardial rub. Abdomen: Soft, nontender, no hepatomegaly, bowel sounds present, no guarding or rebound. Extremities:  No pitting edema, distal pulses 2+. Skin: Warm and dry. Musculoskeletal: No kyphosis. Neuropsychiatric: Alert and oriented x3, affect grossly appropriate.   ECG: ECG is not ordered today.   Recent Labwork:  05/01/2014: Hemoglobin 13.8, platelets 289, BUN 14, creatinine 0.9, potassium 4.7, AST 23, ALT 35, cholesterol 181, try glycerin 144, HDL 67, LDL 85.  Assessment and Plan:  1. Multivessel CAD status post DES circumflex and LAD in 2011. She remains stable without angina on medical therapy and follow-up Cardiolite study from last year was low risk.  2. Hyperlipidemia, on Zocor, LDL  85.  Current medicines were reviewed with the patient today.  Disposition: FU with me in 6 months.   Signed, Jonelle Sidle, MD, [facility] 11/13/2014 1:30 PM    [facility] at [facility] [address] [place], [place], [place] [address] Phone: [phone]; Fax: [phone]

## 2014-11-20 ENCOUNTER — Ambulatory Visit (HOSPITAL_COMMUNITY)
Admission: RE | Admit: 2014-11-20 | Discharge: 2014-11-20 | Disposition: A | Discharge status: Home / Self Care | Payer: Medicare Other | Source: Ambulatory Visit | Attending: Nurse Practitioner | Admitting: Nurse Practitioner

## 2014-11-20 DIAGNOSIS — Z1231 Encounter for screening mammogram for malignant neoplasm of breast: Secondary | ICD-10-CM | POA: Diagnosis present

## 2015-05-23 ENCOUNTER — Ambulatory Visit (INDEPENDENT_AMBULATORY_CARE_PROVIDER_SITE_OTHER): Payer: Medicare Other | Admitting: Cardiology

## 2015-05-23 ENCOUNTER — Other Ambulatory Visit: Payer: Self-pay | Admitting: *Deleted

## 2015-05-23 ENCOUNTER — Encounter: Payer: Self-pay | Admitting: Cardiology

## 2015-05-23 ENCOUNTER — Telehealth: Payer: Self-pay | Admitting: Cardiology

## 2015-05-23 ENCOUNTER — Encounter: Payer: Self-pay | Admitting: *Deleted

## 2015-05-23 VITALS — BP 149/74 | HR 70 | Ht 59.0 in | Wt 107.0 lb

## 2015-05-23 DIAGNOSIS — I25119 Atherosclerotic heart disease of native coronary artery with unspecified angina pectoris: Secondary | ICD-10-CM | POA: Diagnosis not present

## 2015-05-23 DIAGNOSIS — I2510 Atherosclerotic heart disease of native coronary artery without angina pectoris: Secondary | ICD-10-CM

## 2015-05-23 DIAGNOSIS — R030 Elevated blood-pressure reading, without diagnosis of hypertension: Secondary | ICD-10-CM | POA: Diagnosis not present

## 2015-05-23 DIAGNOSIS — E782 Mixed hyperlipidemia: Secondary | ICD-10-CM

## 2015-05-23 DIAGNOSIS — IMO0001 Reserved for inherently not codable concepts without codable children: Secondary | ICD-10-CM

## 2015-05-23 MED ORDER — METOPROLOL SUCCINATE ER 25 MG PO TB24: 25.0000 mg | ORAL_TABLET | Freq: Every day | ORAL | Status: DC

## 2015-05-23 MED ORDER — SIMVASTATIN 20 MG PO TABS: 20.0000 mg | ORAL_TABLET | Freq: Every day | ORAL | Status: DC

## 2015-05-23 NOTE — Patient Instructions (Signed)
Your physician recommends that you continue on your current medications as directed. Please refer to the Current Medication list given to you today. Your physician has requested that you have a lexiscan myoview. For further information please visit [email or web address]. Please follow instruction sheet, as given. Your physician recommends that you schedule a follow-up appointment in: 6 months. You will receive a reminder letter in the mail in about 4 months reminding you to call and schedule your appointment. If you don't receive this letter, please contact our office. 

## 2015-05-23 NOTE — Progress Notes (Signed)
Cardiology Office Note  Date: 05/23/2015   ID: [name], [name] 04-21-1945, MRN [id number]  PCP: Erasmo Downer, NP  Primary Cardiologist: Nona Dell, MD   Chief Complaint  Patient presents with  . Coronary Artery Disease    History of Present Illness: Vanessa Fritz is a 70 y.o. female last seen in August 2016. She presents for a routine follow-up visit. She states that she has noticed recurring episodes of left jaw discomfort when she is stressed out or with activity such as walking up steps. This is been present over the last several months. She has not used nitroglycerin.  I reviewed her tracing today which shows sinus rhythm with short PR interval, otherwise no significant ST segment changes.  We went over her medications which include aspirin, Toprol-XL, Zocor, and as needed nitroglycerin.  Last stress test was in 2015 as noted below. She had no significant ischemia at that time. Discussed updating ischemic testing in light of her symptoms  Past Medical History  Diagnosis Date  . Coronary atherosclerosis of native coronary artery     Multivessel, LVEF 65%, DES circumflex and LAD 8/11  . Hyperlipidemia   . Heart attack [facility]     Past Surgical History  Procedure Laterality Date  . Hernia repair    . Tubal ligation    . Colonoscopy  05/10/2011    Procedure: COLONOSCOPY;  Surgeon: Corbin Ade, MD;  Location: [facility];  Service: Endoscopy;  Laterality: N/A;  8:15 AM    Current Outpatient Prescriptions  Medication Sig Dispense Refill  . acetaminophen (TYLENOL) 650 MG CR tablet Take 650 mg by mouth every 4 (four) hours as needed. For headaches    . alendronate (FOSAMAX) 70 MG tablet Take 70 mg by mouth once a week.     Marland Kitchen aspirin EC 81 MG tablet Take 81 mg by mouth daily.    . cholecalciferol (VITAMIN D) 1000 UNITS tablet Take 2,000 Units by mouth daily.    . metoprolol succinate (TOPROL-XL) 25 MG 24 hr tablet Take 1 tablet (25 mg total) by mouth  daily. 90 tablet 3  . Multiple Vitamin (MULTIVITAMIN WITH MINERALS) TABS Take 1 tablet by mouth daily.    . nitroGLYCERIN (NITROSTAT) 0.4 MG SL tablet Place 1 tablet (0.4 mg total) under the tongue every 5 (five) minutes x 3 doses as needed. For chest pain 25 tablet 3  . simvastatin (ZOCOR) 20 MG tablet Take 1 tablet (20 mg total) by mouth at bedtime. 90 tablet 3  . vitamin B-12 (CYANOCOBALAMIN) 1000 MCG tablet Take 1,000 mcg by mouth daily.    . vitamin C (ASCORBIC ACID) 500 MG tablet Take 500 mg by mouth daily.     No current facility-administered medications for this visit.   Allergies:  Sulfonamide derivatives   Social History: The patient  reports that she quit smoking about 5 years ago. Her smoking use included Cigarettes. She has a .6 pack-year smoking history. She has never used smokeless tobacco. She reports that she does not drink alcohol or use illicit drugs.   Family History: The patient's family history includes Coronary artery disease in her brother, brother, and mother.   ROS:  Please see the history of present illness. Otherwise, complete review of systems is positive for none.  All other systems are reviewed and negative.   Physical Exam: VS:  BP 149/74 mmHg  Pulse 70  Ht  (1.499 m)  Wt 107 lb (48.535 kg)  BMI  21.60 kg/m2  SpO2 100%, BMI Body mass index is 21.6 kg/(m^2).  Wt Readings from Last 3 Encounters:  05/23/15 107 lb (48.535 kg)  11/13/14 109 lb (49.442 kg)  05/09/14 113 lb (51.256 kg)    General: Patient appears comfortable at rest. HEENT: Conjunctiva and lids normal, oropharynx clear. Neck: Supple, no elevated JVP or carotid bruits, no thyromegaly. Lungs: Clear to auscultation, nonlabored breathing at rest. Cardiac: Regular rate and rhythm, no S3 or significant systolic murmur, no pericardial rub. Abdomen: Soft, nontender, no hepatomegaly, bowel sounds present, no guarding or rebound. Extremities: No pitting edema, distal pulses 2+. Skin: Warm and  dry. Musculoskeletal: No kyphosis. Neuropsychiatric: Alert and oriented x3, affect grossly appropriate.  ECG: I personally reviewed the prior tracing from 05/09/2014 which showed normal sinus rhythm with left atrial enlargement.  Recent Labwork:  August 2016: Hemoglobin 14.0, platelets 267, BUN 14, creatinine 1.1, potassium 4.8, AST 13, ALT 11, cholesterol 159, triglycerides 99, HDL 67, LDL 72  Other Studies Reviewed Today:  Exercise Cardiolite 11/16/2013: FINDINGS: Exercise stress  Baseline EKG showed normal sinus rhythm. The patient was stressed according the Bruce protocol for 7 min and 30 seconds, achieving a work level of 8.50 Mets. The resting heart rate is 64 beats per min rose to 127 beats per min, representing 84% of the maximal age predicted heart rate. The resting blood pressure of 119/57 increased to a maximum of 142/72. The test was stopped due to ST depressions, the patient did not experience any chest pain.  Exercise EKG showed nonspecific upsloping ST depressions in the inferior leads, borderline primarily upsloping ST depressions in the lateral precordial leads. Overall findings not specific for ischemia. There were no significant arrhythmias.  Perfusion: No decreased activity in the left ventricle on stress imaging to suggest reversible ischemia or infarction.  Wall Motion: Hyperdynamic left ventricular wall motion. No left ventricular dilation.  Left Ventricular Ejection Fraction: Reported 93 %, elevated value likely due to processing issue  End diastolic volume 33 ml  End systolic volume 2 ml  IMPRESSION: 1. No reversible ischemia or infarction.  2. Normal left ventricular wall motion.  3. Left ventricular ejection fraction 93%, elevated value likely due to processing issue  4. Low-risk stress test findings*.  Assessment and Plan:  1. Newly reported, recurring left jaw discomfort with emotional stress and activity such as walking up  steps. Reminiscent of angina although she has not used nitroglycerin. Her ECG shows no acute ST segment changes. We will obtain a Lexiscan Cardiolite to follow up on ischemia and otherwise continue medical therapy for now.  2. CAD status post DES to the circumflex and LAD in 2011. Follow-up ischemic testing as outlined above.  3. Hyperlipidemia, continues on statin therapy. Recent LDL 90 at check with PCP.  4. Blood pressure is mildly elevated today. She does not have standing history of hypertension.  Current medicines were reviewed with the patient today.   Orders Placed This Encounter  Procedures  . NM Myocar Multi W/Spect W/Wall Motion / EF  . Myocardial Perfusion Imaging  . EKG 12-Lead    Disposition: FU with me in 6 months.   Signed, Jonelle SidleSamuel G. Kimley Apsey, MD, [facility] 05/23/2015 11:21 AM    [facility] [address], [place], [place] [address] Phone: [phone]) [phone]; Fax: [phone]

## 2015-05-23 NOTE — Telephone Encounter (Signed)
Tried to contact patient to notify him of time change of appt on march 16th with Misty StanleyLisa to 3:00 Mailed letter also

## 2015-06-02 ENCOUNTER — Inpatient Hospital Stay (HOSPITAL_COMMUNITY): Admission: RE | Admit: 2015-06-02 | Payer: Medicare Other | Source: Ambulatory Visit

## 2015-06-02 ENCOUNTER — Encounter (HOSPITAL_COMMUNITY)
Admission: RE | Admit: 2015-06-02 | Discharge: 2015-06-02 | Disposition: A | Discharge status: Home / Self Care | Payer: Medicare Other | Source: Ambulatory Visit | Attending: Cardiology | Admitting: Cardiology

## 2015-06-02 ENCOUNTER — Encounter (HOSPITAL_COMMUNITY): Payer: Self-pay

## 2015-06-02 DIAGNOSIS — I25119 Atherosclerotic heart disease of native coronary artery with unspecified angina pectoris: Secondary | ICD-10-CM | POA: Diagnosis not present

## 2015-06-02 LAB — NM MYOCAR MULTI W/SPECT W/WALL MOTION / EF
CHL CUP RESTING HR STRESS: 64 {beats}/min
CSEPPHR: 102 {beats}/min

## 2015-06-02 MED ORDER — TECHNETIUM TC 99M SESTAMIBI - CARDIOLITE
30.0000 | Freq: Once | INTRAVENOUS | Status: AC | PRN
  Administered 2015-06-02: 10:00:00 30 via INTRAVENOUS

## 2015-06-02 MED ORDER — TECHNETIUM TC 99M SESTAMIBI GENERIC - CARDIOLITE
10.0000 | Freq: Once | INTRAVENOUS | Status: AC | PRN
  Administered 2015-06-02: 10 via INTRAVENOUS

## 2015-06-02 MED ORDER — REGADENOSON 0.4 MG/5ML IV SOLN
INTRAVENOUS | Status: AC
  Administered 2015-06-02: 0.4 mg
  Filled 2015-06-02: qty 5

## 2015-06-04 ENCOUNTER — Telehealth: Payer: Self-pay | Admitting: *Deleted

## 2015-06-04 NOTE — Telephone Encounter (Signed)
Patient informed and verbalized understanding of plan. 

## 2015-06-04 NOTE — Telephone Encounter (Signed)
-----   Message from Jonelle SidleSamuel G McDowell, MD sent at 06/03/2015  8:47 PM EDT ----- Reviewed report. Low risk stress test without ischemia. Continue medical therapy and observation. If she continues to have progressive angina, she should let us know in case further workup is needed.

## 2015-10-17 ENCOUNTER — Other Ambulatory Visit (HOSPITAL_COMMUNITY): Payer: Self-pay | Admitting: Nurse Practitioner

## 2015-10-17 DIAGNOSIS — Z1231 Encounter for screening mammogram for malignant neoplasm of breast: Secondary | ICD-10-CM

## 2015-11-24 ENCOUNTER — Ambulatory Visit (HOSPITAL_COMMUNITY)
Admission: RE | Admit: 2015-11-24 | Discharge: 2015-11-24 | Disposition: A | Discharge status: Home / Self Care | Payer: Medicare Other | Source: Ambulatory Visit | Attending: Nurse Practitioner | Admitting: Nurse Practitioner

## 2015-11-24 DIAGNOSIS — Z1231 Encounter for screening mammogram for malignant neoplasm of breast: Secondary | ICD-10-CM | POA: Diagnosis present

## 2015-12-01 ENCOUNTER — Ambulatory Visit (INDEPENDENT_AMBULATORY_CARE_PROVIDER_SITE_OTHER): Payer: Medicare Other | Admitting: Cardiology

## 2015-12-01 ENCOUNTER — Encounter: Payer: Self-pay | Admitting: Cardiology

## 2015-12-01 VITALS — BP 140/60 | HR 76 | Ht 59.0 in | Wt 110.8 lb

## 2015-12-01 DIAGNOSIS — I25119 Atherosclerotic heart disease of native coronary artery with unspecified angina pectoris: Secondary | ICD-10-CM

## 2015-12-01 DIAGNOSIS — E782 Mixed hyperlipidemia: Secondary | ICD-10-CM

## 2015-12-01 MED ORDER — SIMVASTATIN 20 MG PO TABS: 20.0000 mg | ORAL_TABLET | Freq: Every day | ORAL | 3 refills | Status: DC

## 2015-12-01 MED ORDER — NITROGLYCERIN 0.4 MG SL SUBL: 0.4000 mg | SUBLINGUAL_TABLET | SUBLINGUAL | 3 refills | Status: DC | PRN

## 2015-12-01 MED ORDER — METOPROLOL SUCCINATE ER 25 MG PO TB24: 25.0000 mg | ORAL_TABLET | Freq: Every day | ORAL | 3 refills | Status: DC

## 2015-12-01 NOTE — Progress Notes (Signed)
Cardiology Office Note  Date: 12/01/2015   ID: Jeylin, Woodmansee 1945-08-31, MRN [id number]  PCP: Erasmo Downer, NP  Primary Cardiologist: Nona Dell, MD   Chief Complaint  Patient presents with  . Coronary Artery Disease    History of Present Illness: Vanessa Fritz is a 70 y.o. female last seen in March. She is here today for a routine follow-up visit. Since last encounter she does not report any progressive angina symptoms or nitroglycerin use. She had a follow-up Myoview done back in March, overall low risk as outlined below.  We went over her medications. Cardiac regimen includes aspirin, Toprol-XL, Zocor, and as needed nitroglycerin.  She had follow-up lab work done in June, results outlined below. LDL control was better.  Past Medical History:  Diagnosis Date  . Coronary atherosclerosis of native coronary artery    Multivessel, LVEF 65%, DES circumflex and LAD 8/11  . Heart attack (HCC)   . Hyperlipidemia     Current Outpatient Prescriptions  Medication Sig Dispense Refill  . acetaminophen (TYLENOL) 650 MG CR tablet Take 650 mg by mouth every 4 (four) hours as needed. For headaches    . aspirin EC 81 MG tablet Take 81 mg by mouth daily.    . cholecalciferol (VITAMIN D) 1000 UNITS tablet Take 2,000 Units by mouth daily.    . metoprolol succinate (TOPROL-XL) 25 MG 24 hr tablet Take 1 tablet (25 mg total) by mouth daily. 90 tablet 3  . Multiple Vitamin (MULTIVITAMIN WITH MINERALS) TABS Take 1 tablet by mouth daily.    . nitroGLYCERIN (NITROSTAT) 0.4 MG SL tablet Place 1 tablet (0.4 mg total) under the tongue every 5 (five) minutes x 3 doses as needed. For chest pain 25 tablet 3  . simvastatin (ZOCOR) 20 MG tablet Take 1 tablet (20 mg total) by mouth at bedtime. 90 tablet 3  . vitamin B-12 (CYANOCOBALAMIN) 1000 MCG tablet Take 1,000 mcg by mouth daily.    . vitamin C (ASCORBIC ACID) 500 MG tablet Take 500 mg by mouth daily.     No current  facility-administered medications for this visit.    Allergies:  Sulfonamide derivatives   Social History: The patient  reports that she quit smoking about 6 years ago. Her smoking use included Cigarettes. She has a 0.60 pack-year smoking history. She has never used smokeless tobacco. She reports that she does not drink alcohol or use drugs.   ROS:  Please see the history of present illness. Otherwise, complete review of systems is positive for none.  All other systems are reviewed and negative.   Physical Exam: VS:  BP 140/60   Pulse 76   Ht 4\' 11"  (1.499 m)   Wt 110 lb 12.8 oz (50.3 kg)   BMI 22.38 kg/m , BMI Body mass index is 22.38 kg/m.  Wt Readings from Last 3 Encounters:  12/01/15 110 lb 12.8 oz (50.3 kg)  05/23/15 107 lb (48.5 kg)  11/13/14 109 lb (49.4 kg)    General: Patient appears comfortable at rest. HEENT: Conjunctiva and lids normal, oropharynx clear. Neck: Supple, no elevated JVP or carotid bruits, no thyromegaly. Lungs: Clear to auscultation, nonlabored breathing at rest. Cardiac: Regular rate and rhythm, no S3 or significant systolic murmur, no pericardial rub. Abdomen: Soft, nontender, no hepatomegaly, bowel sounds present, no guarding or rebound. Extremities: No pitting edema, distal pulses 2+.  ECG: I personally reviewed the tracing from 05/23/2015 which showed sinus rhythm with short PR interval.  Recent  Labwork:  March 2017: Hemoglobin 13.6, platelets 279, cholesterol 180, triglycerides 122, HDL 66, LDL 90, BUN 15, creatinine 1.0, potassium 4.4, AST 21, ALT 29 August 2015: Hemoglobin 13.6, platelets 298, BUN 12, creatinine 0.9, AST 14, ALT 11, cholesterol 143, triglycerides 84, HDL 66, LDL 60  Other Studies Reviewed Today:  Lexiscan Myoview 06/02/2015:  There was no ST segment deviation noted during stress.  The study is normal. There are no perfusion defects consistent with prior infarct or current ischemia.  This is a low risk study.  The left  ventricular ejection fraction is hyperdynamic (>65%).  Assessment and Plan:  1. CAD status post DES to the circumflex and LAD in 2011. Follow-up Myoview earlier this year was low risk as outlined above. Plan to continue medical therapy and observation for now.  2. Hyperlipidemia, recent LDL down to 60 on simvastatin.  Current medicines were reviewed with the patient today.  Disposition: Follow-up with me in 6 months.  Signed, Jonelle SidleSamuel G. McDowell, MD, [facility] 12/01/2015 10:08 AM    [facility] [address], [place], [place] [address] Phone: [phone]; Fax: [phone]) [phone]

## 2015-12-01 NOTE — Patient Instructions (Signed)

## 2016-01-22 ENCOUNTER — Telehealth: Payer: Self-pay | Admitting: Cardiology

## 2016-01-22 NOTE — Telephone Encounter (Signed)
Busy

## 2016-01-22 NOTE — Telephone Encounter (Signed)
Patient called stating that she is having problems with her blood pressure going up and down for several days now.  She states that she has "foolish feelings in her head"

## 2016-01-29 ENCOUNTER — Encounter: Payer: Self-pay | Admitting: *Deleted

## 2016-01-29 NOTE — Telephone Encounter (Signed)
Attempted to contact patient again - busy.  No designated release found in chart.  Will mail letter as something may be wrong with phone since still getting busy signal.

## 2016-02-16 ENCOUNTER — Encounter: Payer: Self-pay | Admitting: Cardiology

## 2016-06-10 NOTE — Progress Notes (Signed)
Cardiology Office Note  Date: 06/11/2016   ID: Vanessa, [name] 10-Feb-1946, MRN [id number]  PCP: Erasmo Downer, NP  Primary Cardiologist: Nona Dell, MD   Chief Complaint  Patient presents with  . Coronary Artery Disease    History of Present Illness: Vanessa Fritz is a 71 y.o. female last seen in September 2017. She presents for a routine follow-up visit. Continues to do very well. Has rare angina and uses a single nitroglycerin. She has had no progression in symptoms over the last 6 months.  Follow-up Myoview in March of last year was low risk as outlined below. Current cardiac regimen includes aspirin, Toprol-XL, Zocor, and as needed nitroglycerin. Most recent lipid panel is detailed below.  I personally reviewed her ECG today which shows normal sinus rhythm.  Past Medical History:  Diagnosis Date  . Coronary atherosclerosis of native coronary artery    Multivessel, LVEF 65%, DES circumflex and LAD 8/11  . Heart attack   . Hyperlipidemia     Past Surgical History:  Procedure Laterality Date  . COLONOSCOPY  05/10/2011   Procedure: COLONOSCOPY;  Surgeon: Corbin Ade, MD;  [facility];  Service: Endoscopy;  Laterality: N/A;  8:15 AM  . HERNIA REPAIR    . TUBAL LIGATION      Current Outpatient Prescriptions  Medication Sig Dispense Refill  . acetaminophen (TYLENOL) 650 MG CR tablet Take 650 mg by mouth every 4 (four) hours as needed. For headaches    . aspirin EC 81 MG tablet Take 81 mg by mouth daily.    . cholecalciferol (VITAMIN D) 1000 UNITS tablet Take 2,000 Units by mouth daily.    . metoprolol succinate (TOPROL-XL) 25 MG 24 hr tablet Take 1 tablet (25 mg total) by mouth daily. 90 tablet 3  . Multiple Vitamin (MULTIVITAMIN WITH MINERALS) TABS Take 1 tablet by mouth daily.    . nitroGLYCERIN (NITROSTAT) 0.4 MG SL tablet Place 1 tablet (0.4 mg total) under the tongue every 5 (five) minutes x 3 doses as needed. For chest pain 25 tablet  3  . simvastatin (ZOCOR) 20 MG tablet Take 1 tablet (20 mg total) by mouth at bedtime. 90 tablet 3  . vitamin B-12 (CYANOCOBALAMIN) 1000 MCG tablet Take 1,000 mcg by mouth daily.    . vitamin C (ASCORBIC ACID) 500 MG tablet Take 500 mg by mouth daily.     No current facility-administered medications for this visit.    Allergies:  Sulfonamide derivatives   Social History: The patient  reports that she quit smoking about 6 years ago. Her smoking use included Cigarettes. She started smoking about 8 years ago. She has a 0.60 pack-year smoking history. She has never used smokeless tobacco. She reports that she does not drink alcohol or use drugs.   ROS:  Please see the history of present illness. Otherwise, complete review of systems is positive for none.  All other systems are reviewed and negative.   Physical Exam: VS:  BP (!) 145/81   Pulse 70   Ht 4\' 11"  (1.499 m)   Wt 108 lb (49 kg)   BMI 21.81 kg/m , BMI Body mass index is 21.81 kg/m.  Wt Readings from Last 3 Encounters:  06/11/16 108 lb (49 kg)  12/01/15 110 lb 12.8 oz (50.3 kg)  05/23/15 107 lb (48.5 kg)    General: Patient appears comfortable at rest. HEENT: Conjunctiva and lids normal, oropharynx clear. Neck: Supple, no elevated JVP or carotid  bruits, no thyromegaly. Lungs: Clear to auscultation, nonlabored breathing at rest. Cardiac: Regular rate and rhythm, no S3 or significant systolic murmur, no pericardial rub. Abdomen: Soft, nontender, no hepatomegaly, bowel sounds present, no guarding or rebound. Extremities: No pitting edema, distal pulses 2+. Skin: Warm and dry. Musculoskeletal: No kyphosis. Neuropsychiatric: Alert and oriented 3, affect appropriate.  ECG: I personally reviewed the tracing from 05/23/2015 which showed normal sinus rhythm.  Recent Labwork:  June 2017: Hemoglobin 13.6, platelets 298, BUN 12, creatinine 0.9, AST 14, ALT 11, cholesterol 143, triglycerides 84, HDL 66, LDL 60 December 2017:  Hemoglobin 13.6, platelets 264, BUN 13, creatinine 0.95, AST 19, ALT 15, cholesterol 151, triglycerides 100, HDL 65, LDL 66  Other Studies Reviewed Today:  Lexiscan Myoview 06/02/2015:  There was no ST segment deviation noted during stress.  The study is normal. There are no perfusion defects consistent with prior infarct or current ischemia.  This is a low risk study.  The left ventricular ejection fraction is hyperdynamic (>65%).  Assessment and Plan:  1. Multivessel CAD status post DES to the circumflex and LAD in 2011. She reports no progressing angina symptoms on medical therapy and had a low risk follow-up Myoview last year. Continue with observation.  2. Hyperlipidemia, last LDL 65 on statin therapy. No changes made today.  3. Tobacco abuse in remission.  4. Elevated blood pressure, continue on Toprol-XL.  Current medicines were reviewed with the patient today.   Orders Placed This Encounter  Procedures  . EKG 12-Lead    Disposition: Follow-up in 6 months.  Signed, Jonelle SidleSamuel G. Caden Fatica, MD, [facility] 06/11/2016 1:26 PM    [facility] [address], [place], [place] [address] Phone: [phone](336) [phone]; Fax: [phone]) [phone]

## 2016-06-11 ENCOUNTER — Encounter: Payer: Self-pay | Admitting: Cardiology

## 2016-06-11 ENCOUNTER — Ambulatory Visit (INDEPENDENT_AMBULATORY_CARE_PROVIDER_SITE_OTHER): Payer: Medicare Other | Admitting: Cardiology

## 2016-06-11 VITALS — BP 145/81 | HR 70 | Ht 59.0 in | Wt 108.0 lb

## 2016-06-11 DIAGNOSIS — F17201 Nicotine dependence, unspecified, in remission: Secondary | ICD-10-CM | POA: Diagnosis not present

## 2016-06-11 DIAGNOSIS — I25119 Atherosclerotic heart disease of native coronary artery with unspecified angina pectoris: Secondary | ICD-10-CM

## 2016-06-11 DIAGNOSIS — R030 Elevated blood-pressure reading, without diagnosis of hypertension: Secondary | ICD-10-CM | POA: Diagnosis not present

## 2016-06-11 DIAGNOSIS — E782 Mixed hyperlipidemia: Secondary | ICD-10-CM

## 2016-06-11 NOTE — Patient Instructions (Signed)

## 2016-10-14 ENCOUNTER — Other Ambulatory Visit (HOSPITAL_COMMUNITY): Payer: Self-pay | Admitting: Nurse Practitioner

## 2016-10-14 DIAGNOSIS — Z1231 Encounter for screening mammogram for malignant neoplasm of breast: Secondary | ICD-10-CM

## 2016-11-23 ENCOUNTER — Other Ambulatory Visit: Payer: Self-pay | Admitting: Cardiology

## 2016-11-25 ENCOUNTER — Ambulatory Visit (HOSPITAL_COMMUNITY)
Admission: RE | Admit: 2016-11-25 | Discharge: 2016-11-25 | Disposition: A | Discharge status: Home / Self Care | Payer: Medicare Other | Source: Ambulatory Visit | Attending: Nurse Practitioner | Admitting: Nurse Practitioner

## 2016-11-25 DIAGNOSIS — Z1231 Encounter for screening mammogram for malignant neoplasm of breast: Secondary | ICD-10-CM | POA: Diagnosis present

## 2016-12-08 ENCOUNTER — Encounter: Payer: Self-pay | Admitting: Cardiology

## 2016-12-08 ENCOUNTER — Ambulatory Visit (INDEPENDENT_AMBULATORY_CARE_PROVIDER_SITE_OTHER): Payer: Medicare Other | Admitting: Cardiology

## 2016-12-08 VITALS — BP 142/60 | HR 82 | Ht 59.0 in | Wt 106.0 lb

## 2016-12-08 DIAGNOSIS — E782 Mixed hyperlipidemia: Secondary | ICD-10-CM | POA: Diagnosis not present

## 2016-12-08 DIAGNOSIS — I25119 Atherosclerotic heart disease of native coronary artery with unspecified angina pectoris: Secondary | ICD-10-CM | POA: Diagnosis not present

## 2016-12-08 DIAGNOSIS — Z23 Encounter for immunization: Secondary | ICD-10-CM

## 2016-12-08 DIAGNOSIS — F17201 Nicotine dependence, unspecified, in remission: Secondary | ICD-10-CM | POA: Diagnosis not present

## 2016-12-08 NOTE — Patient Instructions (Signed)

## 2016-12-08 NOTE — Progress Notes (Signed)
Cardiology Office Note  Date: 12/08/2016   ID: Vanessa, Fritz 1946-01-30, MRN [id number]  PCP: Erasmo Downer, NP  Primary Cardiologist: Nona Dell, MD   Chief Complaint  Patient presents with  . Coronary Artery Disease    History of Present Illness: Vanessa Fritz is a 71 y.o. female last seen in March. She presents for a routine follow-up visit. She reports no angina symptoms or nitroglycerin use since last encounter. States that she has been taking her medications regularly. She has NYHA class I dyspnea with typical activities, no palpitations or syncope.  Current cardiac regimen includes aspirin, Toprol-XL, Zocor, and as needed nitroglycerin.  Follow-up Myoview from last year was low risk as outlined below.  I reviewed her recent lab work from June.  Past Medical History:  Diagnosis Date  . Coronary atherosclerosis of native coronary artery    Multivessel, LVEF 65%, DES circumflex and LAD 8/11  . Hyperlipidemia   . Myocardial infarction [facility])     Past Surgical History:  Procedure Laterality Date  . COLONOSCOPY  05/10/2011   Procedure: COLONOSCOPY;  Surgeon: Corbin Ade, MD;  [facility];  Service: Endoscopy;  Laterality: N/A;  8:15 AM  . HERNIA REPAIR    . TUBAL LIGATION      Current Outpatient Prescriptions  Medication Sig Dispense Refill  . acetaminophen (TYLENOL) 650 MG CR tablet Take 650 mg by mouth every 4 (four) hours as needed. For headaches    . aspirin EC 81 MG tablet Take 81 mg by mouth daily.    . cholecalciferol (VITAMIN D) 1000 UNITS tablet Take 2,000 Units by mouth daily.    . metoprolol succinate (TOPROL-XL) 25 MG 24 hr tablet TAKE 1 TABLET BY MOUTH ONCE DAILY. 90 tablet 0  . Multiple Vitamin (MULTIVITAMIN WITH MINERALS) TABS Take 1 tablet by mouth daily.    . nitroGLYCERIN (NITROSTAT) 0.4 MG SL tablet Place 1 tablet (0.4 mg total) under the tongue every 5 (five) minutes x 3 doses as needed. For chest pain 25 tablet 3   . simvastatin (ZOCOR) 20 MG tablet TAKE ONE TABLET BY MOUTH AT BEDTIME. 90 tablet 0  . vitamin B-12 (CYANOCOBALAMIN) 1000 MCG tablet Take 1,000 mcg by mouth daily.    . vitamin C (ASCORBIC ACID) 500 MG tablet Take 500 mg by mouth daily.     No current facility-administered medications for this visit.    Allergies:  Sulfonamide derivatives   Social History: The patient  reports that she quit smoking about 7 years ago. Her smoking use included Cigarettes. She started smoking about 9 years ago. She has a 0.60 pack-year smoking history. She has never used smokeless tobacco. She reports that she does not drink alcohol or use drugs.   ROS:  Please see the history of present illness. Otherwise, complete review of systems is positive for none.  All other systems are reviewed and negative.   Physical Exam: VS:  BP (!) 142/60   Pulse 82   Ht  (1.499 m)   Wt 106 lb (48.1 kg)   SpO2 96%   BMI 21.41 kg/m , BMI Body mass index is 21.41 kg/m.  Wt Readings from Last 3 Encounters:  12/08/16 106 lb (48.1 kg)  06/11/16 108 lb (49 kg)  12/01/15 110 lb 12.8 oz (50.3 kg)    General: Patient appears comfortable at rest. HEENT: Conjunctiva and lids normal, oropharynx clear with moist mucosa. Neck: Supple, no elevated JVP or carotid bruits,  no thyromegaly. Lungs: Clear to auscultation, nonlabored breathing at rest. Cardiac: Regular rate and rhythm, no S3 or significant systolic murmur, no pericardial rub. Abdomen: Soft, nontender, no hepatomegaly, bowel sounds present, no guarding or rebound. Extremities: No pitting edema, distal pulses 2+.  ECG: I personally reviewed the tracing from 06/11/2016 which showed normal sinus rhythm.  Recent Labwork:  June 2018: Hemoglobin 13.9, platelets 286, BUN 16, AST 16, potassium 4.8, creatinine 1.05, ALT 16 , cholesterol 163, triglycerides 72, HDL 68, LDL 81  Other Studies Reviewed Today:  Lexiscan Myoview 06/02/2015:  There was no ST segment deviation  noted during stress.  The study is normal. There are no perfusion defects consistent with prior infarct or current ischemia.  This is a low risk study.  The left ventricular ejection fraction is hyperdynamic (>65%).  Assessment and Plan:  1. CAD status post DES to the circumflex and LAD in 2011. Follow-up Myoview from last year was low risk and she reports no progressive angina on medical therapy. Continue with observation.  2. Mixed hyperlipidemia, remains on statin therapy with recent LDL 81.  3. Tobacco abuse in remission.  Current medicines were reviewed with the patient today.   Orders Placed This Encounter  Procedures  . Flu Vaccine QUAD 36+ mos IM    Disposition: Follow-up in 6 months.  Signed, Jonelle Sidle, MD, [facility] 12/08/2016 11:19 AM    [facility] at [facility] [address] [place], [place], [place] [address] Phone: [phone]; Fax: [phone]

## 2017-05-23 ENCOUNTER — Other Ambulatory Visit: Payer: Self-pay | Admitting: Cardiology

## 2017-06-06 NOTE — Progress Notes (Signed)
Cardiology Office Note  Date: 06/07/2017   ID: Robie RidgeMary W Fritz, DOB 03-09-1946, [name]  PCP: Erasmo DownerStrader, Lindsey F, NP  Primary Cardiologist: Nona DellSamuel Ruhi Kopke, MD   Chief Complaint  Patient presents with  . Coronary Artery Disease    History of Present Illness: Vanessa Fritz is a 72 y.o. female last seen in September 2018.  She presents today for a follow-up visit.  Continues to do well.  Reports no angina or nitroglycerin use.  She has been cleaning up some debris in her yard due to downed trees.  Follow-up Myoview from 2017 as outlined below, overall low risk.  I personally reviewed her ECG today which shows sinus rhythm with nonspecific ST changes.  Current cardiac medications include aspirin, Toprol-XL, Zocor, and as needed nitroglycerin.  Will have follow-up lipids this June with PCP.  Past Medical History:  Diagnosis Date  . Coronary atherosclerosis of native coronary artery    Multivessel, LVEF 65%, DES circumflex and LAD 8/11  . Hyperlipidemia   . Myocardial infarction [facility](HCC)     Past Surgical History:  Procedure Laterality Date  . COLONOSCOPY  05/10/2011   Procedure: COLONOSCOPY;  Surgeon: Corbin Adeobert M Rourk, MD;  [facility];  Service: Endoscopy;  Laterality: N/A;  8:15 AM  . HERNIA REPAIR    . TUBAL LIGATION      Current Outpatient Medications  Medication Sig Dispense Refill  . acetaminophen (TYLENOL) 650 MG CR tablet Take 650 mg by mouth every 4 (four) hours as needed. For headaches    . aspirin EC 81 MG tablet Take 81 mg by mouth daily.    . cholecalciferol (VITAMIN D) 1000 UNITS tablet Take 2,000 Units by mouth daily.    . metoprolol succinate (TOPROL-XL) 25 MG 24 hr tablet TAKE 1 TABLET BY MOUTH ONCE DAILY. 90 tablet 1  . Multiple Vitamin (MULTIVITAMIN WITH MINERALS) TABS Take 1 tablet by mouth daily.    . nitroGLYCERIN (NITROSTAT) 0.4 MG SL tablet Place 1 tablet (0.4 mg total) under the tongue every 5 (five) minutes x 3 doses as needed.  For chest pain 25 tablet 3  . simvastatin (ZOCOR) 20 MG tablet TAKE ONE TABLET BY MOUTH AT BEDTIME. 90 tablet 1  . vitamin B-12 (CYANOCOBALAMIN) 1000 MCG tablet Take 1,000 mcg by mouth daily.    . vitamin C (ASCORBIC ACID) 500 MG tablet Take 500 mg by mouth daily.     No current facility-administered medications for this visit.    Allergies:  Sulfonamide derivatives   Social History: The patient  reports that she quit smoking about 7 years ago. Her smoking use included cigarettes. She started smoking about 9 years ago. She has a 0.60 pack-year smoking history. She has never used smokeless tobacco. She reports that she does not drink alcohol or use drugs.   ROS:  Please see the history of present illness. Otherwise, complete review of systems is positive for none.  All other systems are reviewed and negative.   Physical Exam: VS:  BP 100/64   Pulse 71   Ht 4\' 11"  (1.499 m)   Wt 110 lb (49.9 kg)   SpO2 98%   BMI 22.22 kg/m , BMI Body mass index is 22.22 kg/m.  Wt Readings from Last 3 Encounters:  06/07/17 110 lb (49.9 kg)  12/08/16 106 lb (48.1 kg)  06/11/16 108 lb (49 kg)    General: Patient appears comfortable at rest. HEENT: Conjunctiva and lids normal, oropharynx clear. Neck: Supple, no elevated  JVP or carotid bruits, no thyromegaly. Lungs: Clear to auscultation, nonlabored breathing at rest. Cardiac: Regular rate and rhythm, no S3 or significant systolic murmur, no pericardial rub. Abdomen: Soft, nontender, bowel sounds present. Extremities: No pitting edema, distal pulses 2+. Skin: Warm and dry. Musculoskeletal: No kyphosis. Neuropsychiatric: Alert and oriented x3, affect grossly appropriate.  ECG: I personally reviewed the tracing from 06/11/2016 which showed normal sinus rhythm.  Recent Labwork:  June 2018: Hemoglobin 13.9, platelets 286 BUN 16, AST 16, potassium 4.8, creatinine 1.05, ALT 16, cholesterol 163, triglycerides 72, HDL 68, LDL 81  Other Studies Reviewed  Today:  Lexiscan Myoview 06/02/2015:  There was no ST segment deviation noted during stress.  The study is normal. There are no perfusion defects consistent with prior infarct or current ischemia.  This is a low risk study.  The left ventricular ejection fraction is hyperdynamic (>65%).  Assessment and Plan:  1.  Symptomatically stable CAD status post DES to the circumflex and LAD in 2011.  She underwent low risk ischemic testing in 2017 and reports no angina on current medical therapy.  I have reviewed her follow-up ECG.  2.  Mixed hyperlipidemia, she continues on statin therapy, last LDL 81.  She will have repeat lab work this June with PCP.  3.  Tobacco abuse in remission.  Current medicines were reviewed with the patient today.   Orders Placed This Encounter  Procedures  . EKG 12-Lead    Disposition: Follow-up in 6 months.  Signed, Jonelle Sidle, MD, [facility] 06/07/2017 12:00 PM    [facility] at [facility] [address] [place], [place], [place] [address] Phone: [phone]; Fax: [phone]

## 2017-06-07 ENCOUNTER — Ambulatory Visit: Payer: Medicare Other | Admitting: Cardiology

## 2017-06-07 ENCOUNTER — Encounter: Payer: Self-pay | Admitting: Cardiology

## 2017-06-07 VITALS — BP 100/64 | HR 71 | Ht 59.0 in | Wt 110.0 lb

## 2017-06-07 DIAGNOSIS — F17201 Nicotine dependence, unspecified, in remission: Secondary | ICD-10-CM | POA: Diagnosis not present

## 2017-06-07 DIAGNOSIS — E782 Mixed hyperlipidemia: Secondary | ICD-10-CM | POA: Diagnosis not present

## 2017-06-07 DIAGNOSIS — I25119 Atherosclerotic heart disease of native coronary artery with unspecified angina pectoris: Secondary | ICD-10-CM | POA: Diagnosis not present

## 2017-06-07 NOTE — Patient Instructions (Signed)

## 2017-08-26 ENCOUNTER — Encounter: Payer: Self-pay | Admitting: Cardiology

## 2017-08-31 ENCOUNTER — Other Ambulatory Visit (HOSPITAL_COMMUNITY): Payer: Self-pay | Admitting: Nurse Practitioner

## 2017-08-31 DIAGNOSIS — Z1231 Encounter for screening mammogram for malignant neoplasm of breast: Secondary | ICD-10-CM

## 2017-11-07 ENCOUNTER — Other Ambulatory Visit (HOSPITAL_COMMUNITY): Payer: Self-pay | Admitting: Nurse Practitioner

## 2017-11-07 DIAGNOSIS — M810 Age-related osteoporosis without current pathological fracture: Secondary | ICD-10-CM

## 2017-11-15 ENCOUNTER — Other Ambulatory Visit: Payer: Self-pay | Admitting: Cardiology

## 2017-11-28 ENCOUNTER — Ambulatory Visit (HOSPITAL_COMMUNITY)
Admission: RE | Admit: 2017-11-28 | Discharge: 2017-11-28 | Disposition: A | Discharge status: Home / Self Care | Payer: Medicare Other | Source: Ambulatory Visit | Attending: Nurse Practitioner | Admitting: Nurse Practitioner

## 2017-11-28 ENCOUNTER — Encounter (HOSPITAL_COMMUNITY): Payer: Self-pay

## 2017-11-28 DIAGNOSIS — Z1231 Encounter for screening mammogram for malignant neoplasm of breast: Secondary | ICD-10-CM | POA: Diagnosis present

## 2017-11-28 DIAGNOSIS — M810 Age-related osteoporosis without current pathological fracture: Secondary | ICD-10-CM | POA: Insufficient documentation

## 2017-12-07 NOTE — Progress Notes (Signed)
Cardiology Office Note  Date: 12/08/2017   ID: Robie RidgeMary W Colucci, DOB 04-Feb-1946, [name]  PCP: Erasmo DownerStrader, Lindsey F, NP  Primary Cardiologist: Nona DellSamuel McDowell, MD   Chief Complaint  Patient presents with  . Coronary Artery Disease    History of Present Illness: [name] is a 72 y.o. female last seen in March.  She presents for a routine follow-up visit.  Since last encounter she has had no significant angina symptoms, no nitroglycerin use.  Reports NYHA class II dyspnea with typical ADLs.  I reviewed her medications which are outlined below.  Cardiac regimen includes aspirin, Toprol-XL, Zocor, and as needed nitroglycerin.  She reports having lipids with her PCP earlier this year, we will request the results.  All of ischemic testing in 2017 was low risk as outlined below.  Past Medical History:  Diagnosis Date  . Coronary atherosclerosis of native coronary artery    Multivessel, LVEF 65%, DES circumflex and LAD 8/11  . Hyperlipidemia   . Myocardial infarction [facility](HCC)     Past Surgical History:  Procedure Laterality Date  . COLONOSCOPY  05/10/2011   Procedure: COLONOSCOPY;  Surgeon: Corbin Adeobert M Rourk, MD;  [facility];  Service: Endoscopy;  Laterality: N/A;  8:15 AM  . HERNIA REPAIR    . TUBAL LIGATION      Current Outpatient Medications  Medication Sig Dispense Refill  . acetaminophen (TYLENOL) 650 MG CR tablet Take 650 mg by mouth every 4 (four) hours as needed. For headaches    . aspirin EC 81 MG tablet Take 81 mg by mouth daily.    . cholecalciferol (VITAMIN D) 1000 UNITS tablet Take 2,000 Units by mouth daily.    . metoprolol succinate (TOPROL-XL) 25 MG 24 hr tablet TAKE 1 TABLET BY MOUTH ONCE DAILY. 90 tablet 2  . Multiple Vitamin (MULTIVITAMIN WITH MINERALS) TABS Take 1 tablet by mouth daily.    . nitroGLYCERIN (NITROSTAT) 0.4 MG SL tablet Place 1 tablet (0.4 mg total) under the tongue every 5 (five) minutes x 3 doses as needed. For chest pain  25 tablet 3  . simvastatin (ZOCOR) 20 MG tablet TAKE ONE TABLET BY MOUTH AT BEDTIME. 90 tablet 2  . vitamin B-12 (CYANOCOBALAMIN) 1000 MCG tablet Take 1,000 mcg by mouth daily.    . vitamin C (ASCORBIC ACID) 500 MG tablet Take 500 mg by mouth daily.     No current facility-administered medications for this visit.    Allergies:  Sulfonamide derivatives   Social History: The patient  reports that she quit smoking about 8 years ago. Her smoking use included cigarettes. She started smoking about 10 years ago. She has a 0.60 pack-year smoking history. She has never used smokeless tobacco. She reports that she does not drink alcohol or use drugs.   ROS:  Please see the history of present illness. Otherwise, complete review of systems is positive for none.  All other systems are reviewed and negative.   Physical Exam: VS:  BP (!) 150/64   Pulse 78   Ht 4\' 11"  (1.499 m)   Wt 111 lb (50.3 kg)   SpO2 98%   BMI 22.42 kg/m , BMI Body mass index is 22.42 kg/m.  Wt Readings from Last 3 Encounters:  12/08/17 111 lb (50.3 kg)  06/07/17 110 lb (49.9 kg)  12/08/16 106 lb (48.1 kg)    General: Patient appears comfortable at rest. HEENT: Conjunctiva and lids normal, oropharynx clear. Neck: Supple, no elevated JVP or  carotid bruits, no thyromegaly. Lungs: Clear to auscultation, nonlabored breathing at rest. Cardiac: Regular rate and rhythm, no S3 or significant systolic murmur. Abdomen: Soft, nontender, bowel sounds present. Extremities: No pitting edema, distal pulses 2+. Skin: Warm and dry. Musculoskeletal: No kyphosis. Neuropsychiatric: Alert and oriented x3, affect grossly appropriate.  ECG: I personally reviewed the tracing from 06/07/2017 which showed sinus rhythm with nonspecific ST changes.  Recent Labwork:  June 2019: Cholesterol 158, HDL 62, LDL 77, triglycerides 103, BUN 12, creatinine 1.06, potassium 4.8, AST 16, ALT 14, hemoglobin 13.3, platelets 263  Other Studies Reviewed  Today:  Lexiscan Myoview 06/02/2015:  There was no ST segment deviation noted during stress.  The study is normal. There are no perfusion defects consistent with prior infarct or current ischemia.  This is a low risk study.  The left ventricular ejection fraction is hyperdynamic (>65%).  Assessment and Plan:  1.  CAD status post DES to the circumflex and LAD in 2011.  She is doing well without angina on medical therapy.  Follow-up ischemic testing from 2017 was low risk.  Continue with observation.  2.  Mixed hyperlipidemia on Zocor.  Keep follow-up with PCP.  We are requesting her last lipid panel.  3.  Tobacco abuse in remission.  Current medicines were reviewed with the patient today.  Disposition: Follow-up in 6 months.  Signed, Jonelle Sidle, MD, [facility] 12/08/2017 11:49 AM    [facility] at [facility] [address] [place], [place], [place] [address] Phone: [phone]; Fax: [phone]

## 2017-12-08 ENCOUNTER — Ambulatory Visit: Payer: Medicare Other | Admitting: Cardiology

## 2017-12-08 ENCOUNTER — Encounter: Payer: Self-pay | Admitting: *Deleted

## 2017-12-08 ENCOUNTER — Encounter: Payer: Self-pay | Admitting: Cardiology

## 2017-12-08 VITALS — BP 150/64 | HR 78 | Ht 59.0 in | Wt 111.0 lb

## 2017-12-08 DIAGNOSIS — F17201 Nicotine dependence, unspecified, in remission: Secondary | ICD-10-CM

## 2017-12-08 DIAGNOSIS — Z23 Encounter for immunization: Secondary | ICD-10-CM | POA: Diagnosis not present

## 2017-12-08 DIAGNOSIS — I25119 Atherosclerotic heart disease of native coronary artery with unspecified angina pectoris: Secondary | ICD-10-CM | POA: Diagnosis not present

## 2017-12-08 DIAGNOSIS — E782 Mixed hyperlipidemia: Secondary | ICD-10-CM

## 2017-12-08 NOTE — Patient Instructions (Signed)

## 2018-05-17 ENCOUNTER — Other Ambulatory Visit: Payer: Self-pay | Admitting: Cardiology

## 2018-06-05 ENCOUNTER — Telehealth: Payer: Self-pay | Admitting: Cardiology

## 2018-06-05 NOTE — Telephone Encounter (Signed)
   Primary cardiologist: Dr. Jonelle Sidle  Patient contacted in light of the escalating COVID-19 pandemic with pending routine office visit in [facility] on March 25 at 11:20 AM in an effort to determine whether this could be potentially deferred.  From a cardiac perspective, she reports no active angina on medical therapy, no increasing shortness of breath, no palpitations or syncope.  She has had poor appetite, plans to try boost or Ensure.  After discussing the situation, she would like to defer her visit at this time.  Plan: Reschedule routine office visit for 2 months in [place].  She is encouraged to contact the office if she develops any new cardiac symptoms or concerns.  Jonelle Sidle, M.D., F.A.C.C.

## 2018-06-07 ENCOUNTER — Ambulatory Visit: Payer: Medicare Other | Admitting: Cardiology

## 2018-06-07 NOTE — Telephone Encounter (Signed)
Appointment rescheduled to 08/18/2018.

## 2018-08-14 ENCOUNTER — Other Ambulatory Visit: Payer: Self-pay | Admitting: Cardiology

## 2018-08-18 ENCOUNTER — Ambulatory Visit: Payer: Medicare Other | Admitting: Cardiology

## 2018-10-16 ENCOUNTER — Ambulatory Visit: Payer: Medicare Other | Admitting: Cardiology

## 2018-10-16 ENCOUNTER — Other Ambulatory Visit: Payer: Self-pay

## 2018-10-16 ENCOUNTER — Encounter: Payer: Self-pay | Admitting: *Deleted

## 2018-10-16 ENCOUNTER — Encounter: Payer: Self-pay | Admitting: Cardiology

## 2018-10-16 ENCOUNTER — Telehealth: Payer: Self-pay | Admitting: Cardiology

## 2018-10-16 VITALS — BP 132/70 | HR 90 | Temp 97.5°F | Ht 59.0 in | Wt 103.0 lb

## 2018-10-16 DIAGNOSIS — E782 Mixed hyperlipidemia: Secondary | ICD-10-CM | POA: Diagnosis not present

## 2018-10-16 DIAGNOSIS — F17201 Nicotine dependence, unspecified, in remission: Secondary | ICD-10-CM | POA: Diagnosis not present

## 2018-10-16 DIAGNOSIS — I25119 Atherosclerotic heart disease of native coronary artery with unspecified angina pectoris: Secondary | ICD-10-CM

## 2018-10-16 NOTE — Telephone Encounter (Signed)

## 2018-10-16 NOTE — Progress Notes (Signed)
Cardiology Office Note  Date: 10/16/2018   ID: Tarrah, Furuta [birth year], MRN [id number]  PCP:  Renee Rival, NP  Cardiologist:  Rozann Lesches, MD Electrophysiologist:  None   Chief Complaint  Patient presents with  . Cardiac follow-up    History of Present Illness: KOREA SEVERS is a 73 y.o. female last seen in September 2019.  She is here for a routine visit.  She tells me that she has been doing well without angina symptoms, no nitroglycerin use.  She remains functional with house chores, working in her garden.  She reports NYHA class II dyspnea, no palpitations or syncope.  We went over her medications which are outlined below.  She reports no intolerances.  She had her lab work checked per PCP recently, we are requesting the results.  No side effects on Zocor.  I personally reviewed her ECG today which shows normal sinus rhythm with nonspecific ST changes.  Past Medical History:  Diagnosis Date  . Coronary atherosclerosis of native coronary artery    Multivessel, LVEF 65%, DES circumflex and LAD 8/11  . Hyperlipidemia   . Myocardial infarction [facility])     Past Surgical History:  Procedure Laterality Date  . COLONOSCOPY  05/10/2011   Procedure: COLONOSCOPY;  Surgeon: Daneil Dolin, MD;  [facility];  Service: Endoscopy;  Laterality: N/A;  8:15 AM  . HERNIA REPAIR    . TUBAL LIGATION      Current Outpatient Medications  Medication Sig Dispense Refill  . acetaminophen (TYLENOL) 650 MG CR tablet Take 650 mg by mouth every 4 (four) hours as needed. For headaches    . aspirin EC 81 MG tablet Take 81 mg by mouth daily.    . cholecalciferol (VITAMIN D) 1000 UNITS tablet Take 2,000 Units by mouth daily.    . metoprolol succinate (TOPROL-XL) 25 MG 24 hr tablet TAKE 1 TABLET BY MOUTH ONCE DAILY. 90 tablet 3  . Multiple Vitamin (MULTIVITAMIN WITH MINERALS) TABS Take 1 tablet by mouth daily.    . nitroGLYCERIN (NITROSTAT) 0.4 MG SL tablet DISSOLVE  (1) TABLET UNDER TONGUE AS NEEDED TO RELIEVE CHEST PAIN. MAYREPEAT EVERY 5 MINUTES. 25 tablet 0  . simvastatin (ZOCOR) 20 MG tablet TAKE ONE TABLET BY MOUTH AT BEDTIME. 90 tablet 3  . vitamin B-12 (CYANOCOBALAMIN) 1000 MCG tablet Take 1,000 mcg by mouth daily.    . vitamin C (ASCORBIC ACID) 500 MG tablet Take 500 mg by mouth daily.     No current facility-administered medications for this visit.    Allergies:  Sulfonamide derivatives   Social History: The patient  reports that she quit smoking about 9 years ago. Her smoking use included cigarettes. She started smoking about 11 years ago. She has a 0.60 pack-year smoking history. She has never used smokeless tobacco. She reports that she does not drink alcohol or use drugs.   ROS:  Please see the history of present illness. Otherwise, complete review of systems is positive for none.  All other systems are reviewed and negative.   Physical Exam: VS:  BP 132/70   Pulse 90   Temp (!) 97.5 F (36.4 C)   Ht 4\' 11"  (1.499 m)   Wt 103 lb (46.7 kg)   SpO2 98%   BMI 20.80 kg/m , BMI Body mass index is 20.8 kg/m.  Wt Readings from Last 3 Encounters:  10/16/18 103 lb (46.7 kg)  12/08/17 111 lb (50.3 kg)  06/07/17 110 lb (49.9  kg)    General: Patient appears comfortable at rest. HEENT: Conjunctiva and lids normal, wearing a mask. Neck: Supple, no elevated JVP or carotid bruits, no thyromegaly. Lungs: Clear to auscultation, nonlabored breathing at rest. Cardiac: Regular rate and rhythm, no S3 or significant systolic murmur. Abdomen: Soft, nontender, bowel sounds present. Extremities: No pitting edema, distal pulses 2+. Skin: Warm and dry. Musculoskeletal: No kyphosis. Neuropsychiatric: Alert and oriented x3, affect grossly appropriate.  ECG:  An ECG dated 06/07/2017 was personally reviewed today and demonstrated:  Sinus rhythm with nonspecific ST changes.  Recent Labwork:  June 2019: Cholesterol 158, HDL 62, LDL 77, triglycerides 103,  BUN 12, creatinine 1.06, potassium 4.8, AST 16, ALT 14, hemoglobin 13.3, platelets 263  Other Studies Reviewed Today:  Lexiscan Myoview 06/02/2015:  There was no ST segment deviation noted during stress.  The study is normal. There are no perfusion defects consistent with prior infarct or current ischemia.  This is a low risk study.  The left ventricular ejection fraction is hyperdynamic (>65%).  Assessment and Plan:  1.  CAD status post DES to the circumflex and LAD in 2011.  She reports no angina symptoms, ECG reviewed and stable.  Continue medical therapy including aspirin and statin.  2.  Mixed hyperlipidemia on Zocor.  Requesting lab work from PCP.  3.  Tobacco abuse in remission.  Medication Adjustments/Labs and Tests Ordered: Current medicines are reviewed at length with the patient today.  Concerns regarding medicines are outlined above.   Tests Ordered: Orders Placed This Encounter  Procedures  . EKG 12-Lead    Medication Changes: No orders of the defined types were placed in this encounter.   Disposition:  Follow up 6 months in the [place] office.  Signed, Jonelle SidleSamuel G. McDowell, MD, [facility] 10/16/2018 3:11 PM    [facility] [address] [place], [place], [place] [address] Phone: [phone]) [phone]; Fax: [phone]

## 2018-10-16 NOTE — Patient Instructions (Signed)

## 2018-10-20 ENCOUNTER — Other Ambulatory Visit (HOSPITAL_COMMUNITY): Payer: Self-pay | Admitting: Nurse Practitioner

## 2018-10-20 DIAGNOSIS — Z1231 Encounter for screening mammogram for malignant neoplasm of breast: Secondary | ICD-10-CM

## 2018-12-04 ENCOUNTER — Ambulatory Visit (HOSPITAL_COMMUNITY)
Admission: RE | Admit: 2018-12-04 | Discharge: 2018-12-04 | Disposition: A | Discharge status: Home / Self Care | Payer: Medicare Other | Source: Ambulatory Visit | Attending: Nurse Practitioner | Admitting: Nurse Practitioner

## 2018-12-04 ENCOUNTER — Other Ambulatory Visit: Payer: Self-pay

## 2018-12-04 DIAGNOSIS — Z1231 Encounter for screening mammogram for malignant neoplasm of breast: Secondary | ICD-10-CM | POA: Diagnosis not present

## 2019-04-10 ENCOUNTER — Telehealth: Payer: Self-pay | Admitting: Cardiology

## 2019-04-10 NOTE — Telephone Encounter (Signed)
Virtual Visit Pre-Appointment Phone Call  "(Name), I am calling you today to discuss your upcoming appointment. We are currently trying to limit exposure to the virus that causes COVID-19 by seeing patients at home rather than in the office."  1. "What is the BEST phone number to call the day of the visit?" - include this in appointment notes  2. "Do you have or have access to (through a family member/friend) a smartphone with video capability that we can use for your visit?" a. If yes - list this number in appt notes as "cell" (if different from BEST phone #) and list the appointment type as a VIDEO visit in appointment notes b. If no - list the appointment type as a PHONE visit in appointment notes  Confirm consent - "In the setting of the current Covid19 crisis, you are scheduled for a (phone or video) visit with your provider on (date) at (time).  Just as we do with many in-office visits, in order for you to participate in this visit, we must obtain consent.  If you'd like, I can send this to your mychart (if signed up) or email for you to review.  Otherwise, I can obtain your verbal consent now.  All virtual visits are billed to your insurance company just like a normal visit would be.  By agreeing to a virtual visit, we'd like you to understand that the technology does not allow for your provider to perform an examination, and thus may limit your provider's ability to fully assess your condition. If your provider identifies any concerns that need to be evaluated in person, we will make arrangements to do so.  Finally, though the technology is pretty good, we cannot assure that it will always work on either your or our end, and in the setting of a video visit, we may have to convert it to a phone-only visit.  In either situation, we cannot ensure that we have a secure connection.  Are you willing to proceed?" STAFF: Did the patient verbally acknowledge consent to telehealth visit? Document  YES/NO here: YES  3. Advise patient to be prepared - "Two hours prior to your appointment, go ahead and check your blood pressure, pulse, oxygen saturation, and your weight (if you have the equipment to check those) and write them all down. When your visit starts, your provider will ask you for this information. If you have an Apple Watch or Kardia device, please plan to have heart rate information ready on the day of your appointment. Please have a pen and paper handy nearby the day of the visit as well."  4. Give patient instructions for MyChart download to smartphone OR Doximity/Doxy.me as below if video visit (depending on what platform provider is using)  5. Inform patient they will receive a phone call 15 minutes prior to their appointment time (may be from unknown caller ID) so they should be prepared to answer    TELEPHONE CALL NOTE  ARCHANA ECKMAN has been deemed a candidate for a follow-up tele-health visit to limit community exposure during the Covid-19 pandemic. I spoke with the patient via phone to ensure availability of phone/video source, confirm preferred email & phone number, and discuss instructions and expectations.  I reminded SHAHRZAD KOBLE to be prepared with any vital sign and/or heart rhythm information that could potentially be obtained via home monitoring, at the time of her visit. I reminded KENLEE VOGT to expect a phone call prior to her  visit.  Weston Anna 04/10/2019 1:42 PM   INSTRUCTIONS FOR DOWNLOADING THE MYCHART APP TO SMARTPHONE  - The patient must first make sure to have activated MyChart and know their login information - If Apple, go to [employer] and type in MyChart in the search bar and download the app. If Android, ask patient to go to [employer] and type in [place] in the search bar and download the app. The app is free but as with any other app downloads, their phone may require them to verify saved payment information or  Apple/Android password.  - The patient will need to then log into the app with their MyChart username and password, and select [facility] as their healthcare provider to link the account. When it is time for your visit, go to the MyChart app, find appointments, and click Begin Video Visit. Be sure to Select Allow for your device to access the Microphone and Camera for your visit. You will then be connected, and your provider will be with you shortly.  **If they have any issues connecting, or need assistance please contact MyChart service desk [phone])**  **If using a computer, in order to ensure the best quality for their visit they will need to use either of the following Internet Browsers: [employer], or Google Chrome**  IF USING DOXIMITY or DOXY.ME - The patient will receive a link just prior to their visit by text.     FULL LENGTH CONSENT FOR TELE-HEALTH VISIT   I hereby voluntarily request, consent and authorize [facility] and its employed or contracted physicians, physician assistants, nurse practitioners or other licensed health care professionals (the Practitioner), to provide me with telemedicine health care services (the "Services") as deemed necessary by the treating Practitioner. I acknowledge and consent to receive the Services by the Practitioner via telemedicine. I understand that the telemedicine visit will involve communicating with the Practitioner through live audiovisual communication technology and the disclosure of certain medical information by electronic transmission. I acknowledge that I have been given the opportunity to request an in-person assessment or other available alternative prior to the telemedicine visit and am voluntarily participating in the telemedicine visit.  I understand that I have the right to withhold or withdraw my consent to the use of telemedicine in the course of my care at any time, without affecting my right to future care  or treatment, and that the Practitioner or I may terminate the telemedicine visit at any time. I understand that I have the right to inspect all information obtained and/or recorded in the course of the telemedicine visit and may receive copies of available information for a reasonable fee.  I understand that some of the potential risks of receiving the Services via telemedicine include:  Marland Kitchen Delay or interruption in medical evaluation due to technological equipment failure or disruption; . Information transmitted may not be sufficient (e.g. poor resolution of images) to allow for appropriate medical decision making by the Practitioner; and/or  . In rare instances, security protocols could fail, causing a breach of personal health information.  Furthermore, I acknowledge that it is my responsibility to provide information about my medical history, conditions and care that is complete and accurate to the best of my ability. I acknowledge that Practitioner's advice, recommendations, and/or decision may be based on factors not within their control, such as incomplete or inaccurate data provided by me or distortions of diagnostic images or specimens that may result from electronic transmissions. I understand  that the practice of medicine is not an exact science and that Practitioner makes no warranties or guarantees regarding treatment outcomes. I acknowledge that I will receive a copy of this consent concurrently upon execution via email to the email address I last provided but may also request a printed copy by calling the office of [facility].    I understand that my insurance will be billed for this visit.   I have read or had this consent read to me. . I understand the contents of this consent, which adequately explains the benefits and risks of the Services being provided via telemedicine.  . I have been provided ample opportunity to ask questions regarding this consent and the Services and have had  my questions answered to my satisfaction. . I give my informed consent for the services to be provided through the use of telemedicine in my medical care  By participating in this telemedicine visit I agree to the above.

## 2019-04-17 ENCOUNTER — Encounter: Payer: Self-pay | Admitting: Cardiology

## 2019-04-17 ENCOUNTER — Encounter: Payer: Self-pay | Admitting: *Deleted

## 2019-04-17 ENCOUNTER — Telehealth (INDEPENDENT_AMBULATORY_CARE_PROVIDER_SITE_OTHER): Payer: Medicare Other | Admitting: Cardiology

## 2019-04-17 VITALS — BP 106/53 | Ht 59.0 in | Wt 108.0 lb

## 2019-04-17 DIAGNOSIS — I25119 Atherosclerotic heart disease of native coronary artery with unspecified angina pectoris: Secondary | ICD-10-CM | POA: Diagnosis not present

## 2019-04-17 DIAGNOSIS — E782 Mixed hyperlipidemia: Secondary | ICD-10-CM | POA: Diagnosis not present

## 2019-04-17 NOTE — Patient Instructions (Addendum)

## 2019-04-17 NOTE — Progress Notes (Signed)
Virtual Visit via Telephone Note   This visit type was conducted due to national recommendations for restrictions regarding the COVID-19 Pandemic (e.g. social distancing) in an effort to limit this patient's exposure and mitigate transmission in our community.  Due to her co-morbid illnesses, this patient is at least at moderate risk for complications without adequate follow up.  This format is felt to be most appropriate for this patient at this time.  The patient did not have access to video technology/had technical difficulties with video requiring transitioning to audio format only (telephone).  All issues noted in this document were discussed and addressed.  No physical exam could be performed with this format.  Please refer to the patient's chart for her  consent to telehealth for [facility].   Date:  04/17/2019   ID:  Sahily, Biddle 1945-08-07, MRN [id number]  Patient Location: Home Provider Location: Office  PCP:  Erasmo Downer, NP  Cardiologist:  Nona Dell, MD Electrophysiologist:  None   Evaluation Performed:  Follow-Up Visit  Chief Complaint:  Cardiac follow-up  History of Present Illness:    LENYA STERNE is a 74 y.o. female last seen in August 2020.  We spoke by phone today.  From a cardiac perspective, she does not report any significant angina or nitroglycerin use.  Remains functional with ADLs.  She has been staying around her house a lot during the pandemic, seems somewhat frustrated with this but wants to be safe.  She does plan to get the coronavirus vaccine.  I reviewed her medications which are outlined below and stable from a cardiac perspective.  She reports no intolerances.  She states that she had recent lab work with PCP which we are requesting for review.   Past Medical History:  Diagnosis Date  . Coronary atherosclerosis of native coronary artery    Multivessel, LVEF 65%, DES circumflex and LAD 8/11  . Hyperlipidemia   . Myocardial  infarction [facility])    Past Surgical History:  Procedure Laterality Date  . COLONOSCOPY  05/10/2011   Procedure: COLONOSCOPY;  Surgeon: Corbin Ade, MD;  [facility];  Service: Endoscopy;  Laterality: N/A;  8:15 AM  . HERNIA REPAIR    . TUBAL LIGATION       Current Meds  Medication Sig  . acetaminophen (TYLENOL) 650 MG CR tablet Take 650 mg by mouth every 4 (four) hours as needed. For headaches  . aspirin EC 81 MG tablet Take 81 mg by mouth daily.  . cholecalciferol (VITAMIN D) 1000 UNITS tablet Take 1,000 Units by mouth daily.   . metoprolol succinate (TOPROL-XL) 25 MG 24 hr tablet TAKE 1 TABLET BY MOUTH ONCE DAILY.  . Multiple Vitamin (MULTIVITAMIN WITH MINERALS) TABS Take 1 tablet by mouth daily.  . nitroGLYCERIN (NITROSTAT) 0.4 MG SL tablet DISSOLVE (1) TABLET UNDER TONGUE AS NEEDED TO RELIEVE CHEST PAIN. MAYREPEAT EVERY 5 MINUTES.  Marland Kitchen simvastatin (ZOCOR) 20 MG tablet TAKE ONE TABLET BY MOUTH AT BEDTIME.  . vitamin B-12 (CYANOCOBALAMIN) 1000 MCG tablet Take 1,000 mcg by mouth daily.  . vitamin C (ASCORBIC ACID) 500 MG tablet Take 500 mg by mouth daily.     Allergies:   Sulfonamide derivatives   Social History   Tobacco Use  . Smoking status: Former Smoker    Packs/day: 0.30    Years: 2.00    Pack years: 0.60    Types: Cigarettes    Start date: 08/05/2007    Quit date: 10/13/2009  Years since quitting: 9.5  . Smokeless tobacco: Never Used  Substance Use Topics  . Alcohol use: No    Alcohol/week: 0.0 standard drinks  . Drug use: No     Family Hx: The patient's family history includes Coronary artery disease in her brother, brother, and mother.  ROS:   Please see the history of present illness. All other systems reviewed and are negative.   Prior CV studies:   The following studies were reviewed today:  Lexiscan Myoview 06/02/2015:  There was no ST segment deviation noted during stress.  The study is normal. There are no perfusion defects consistent  with prior infarct or current ischemia.  This is a low risk study.  The left ventricular ejection fraction is hyperdynamic (>65%).  Labs/Other Tests and Data Reviewed:    EKG:  An ECG dated 10/16/2018 was personally reviewed today and demonstrated:  Sinus rhythm with nonspecific ST changes.  Recent Labs:  August 2020: Cholesterol 169, triglycerides 86, HDL 72, LDL 80 July 2020: BUN 15, creatinine 1.01, potassium 5.2, AST 13, ALT 12, hemoglobin 13.7, platelets 290  Wt Readings from Last 3 Encounters:  04/17/19 108 lb (49 kg)  10/16/18 103 lb (46.7 kg)  12/08/17 111 lb (50.3 kg)     Objective:    Vital Signs:  BP (!) 106/53   Ht 4\' 11"  (1.499 m)   Wt 108 lb (49 kg)   BMI 21.81 kg/m    Patient spoke in full sentences, not short of breath. No audible wheezing or coughing. Speech pattern normal.  ASSESSMENT & PLAN:    1.  CAD status post DES to the circumflex and LAD in 2011.  She remains clinically stable without active angina on medical therapy.  Continue treatment with aspirin, Toprol-XL, and Zocor.  2.  Mixed hyperlipidemia.  She continues on Zocor.  Requesting recent lab work from PCP.  COVID-19 Education: The signs and symptoms of COVID-19 were discussed with the patient and how to seek care for testing (follow up with PCP or arrange E-visit).  The importance of social distancing was discussed today.  Time:   Today, I have spent 6 minutes with the patient with telehealth technology discussing the above problems.     Medication Adjustments/Labs and Tests Ordered: Current medicines are reviewed at length with the patient today.  Concerns regarding medicines are outlined above.   Tests Ordered: No orders of the defined types were placed in this encounter.   Medication Changes: No orders of the defined types were placed in this encounter.   Follow Up:  In Person 6 months in the [place] office.  Signed, Rozann Lesches, MD  04/17/2019 9:22 AM    [place]

## 2019-08-10 ENCOUNTER — Other Ambulatory Visit: Payer: Self-pay | Admitting: Cardiology

## 2019-08-10 MED ORDER — NITROGLYCERIN 0.4 MG SL SUBL: SUBLINGUAL_TABLET | SUBLINGUAL | 3 refills | Status: DC

## 2019-10-18 ENCOUNTER — Ambulatory Visit: Payer: Medicare Other | Admitting: Cardiology

## 2019-10-18 ENCOUNTER — Encounter: Payer: Self-pay | Admitting: Cardiology

## 2019-10-18 VITALS — BP 140/62 | HR 80 | Ht 59.0 in | Wt 109.0 lb

## 2019-10-18 DIAGNOSIS — E782 Mixed hyperlipidemia: Secondary | ICD-10-CM | POA: Diagnosis not present

## 2019-10-18 DIAGNOSIS — I25119 Atherosclerotic heart disease of native coronary artery with unspecified angina pectoris: Secondary | ICD-10-CM | POA: Diagnosis not present

## 2019-10-18 MED ORDER — NITROGLYCERIN 0.4 MG SL SUBL: 0.4000 mg | SUBLINGUAL_TABLET | SUBLINGUAL | 3 refills | Status: DC | PRN

## 2019-10-18 MED ORDER — SIMVASTATIN 20 MG PO TABS: 20.0000 mg | ORAL_TABLET | Freq: Every day | ORAL | 3 refills | Status: DC

## 2019-10-18 MED ORDER — METOPROLOL SUCCINATE ER 25 MG PO TB24: 25.0000 mg | ORAL_TABLET | Freq: Every day | ORAL | 3 refills | Status: DC

## 2019-10-18 NOTE — Patient Instructions (Addendum)

## 2019-10-18 NOTE — Progress Notes (Signed)
Cardiology Office Note  Date: 10/18/2019   ID: Vanessa Fritz, Vanessa Fritz [birth year], MRN [id number]  PCP:  Erasmo Downer, NP  Cardiologist:  Nona Dell, MD Electrophysiologist:  None   Chief Complaint  Patient presents with  . Cardiac follow-up    History of Present Illness: Vanessa Fritz is a 74 y.o. female last assessed via telehealth encounter in February.  She presents for a routine visit.  States that she is doing very well, no active angina symptoms or nitroglycerin use.  I reviewed her lab work from January as outlined below.  She had repeat lab work done by PCP just this morning, results pending.  We went over her medications.  She needed refills for Toprol-XL, Zocor, and as needed nitroglycerin.  Past Medical History:  Diagnosis Date  . Coronary atherosclerosis of native coronary artery    Multivessel, LVEF 65%, DES circumflex and LAD 8/11  . Hyperlipidemia   . Myocardial infarction [facility])     Past Surgical History:  Procedure Laterality Date  . COLONOSCOPY  05/10/2011   Procedure: COLONOSCOPY;  Surgeon: Corbin Ade, MD;  [facility];  Service: Endoscopy;  Laterality: N/A;  8:15 AM  . HERNIA REPAIR    . TUBAL LIGATION      Current Outpatient Medications  Medication Sig Dispense Refill  . acetaminophen (TYLENOL) 650 MG CR tablet Take 650 mg by mouth every 4 (four) hours as needed. For headaches    . aspirin EC 81 MG tablet Take 81 mg by mouth daily.    . cholecalciferol (VITAMIN D) 1000 UNITS tablet Take 1,000 Units by mouth daily.     . metoprolol succinate (TOPROL-XL) 25 MG 24 hr tablet TAKE 1 TABLET BY MOUTH ONCE DAILY. 90 tablet 0  . Multiple Vitamin (MULTIVITAMIN WITH MINERALS) TABS Take 1 tablet by mouth daily.    . nitroGLYCERIN (NITROSTAT) 0.4 MG SL tablet DISSOLVE (1) TABLET UNDER TONGUE EVERY 5 MINUTES UP TO 3 DOSES AS NEEDED TO RELIEVE CHEST PAIN. IF NO RELIEF AFTER 3RD DOSE, PROCEED TO ED FOR AN EVALUATION OR CALL 911 25 tablet  3  . simvastatin (ZOCOR) 20 MG tablet TAKE ONE TABLET BY MOUTH AT BEDTIME. 90 tablet 0  . vitamin B-12 (CYANOCOBALAMIN) 1000 MCG tablet Take 1,000 mcg by mouth daily.    . vitamin C (ASCORBIC ACID) 500 MG tablet Take 500 mg by mouth daily.     No current facility-administered medications for this visit.   Allergies:  Sulfonamide derivatives   ROS:   No palpitations or syncope.  Physical Exam: VS:  BP 140/62   Pulse 80   Ht 4\' 11"  (1.499 m)   Wt 109 lb (49.4 kg)   BMI 22.02 kg/m , BMI Body mass index is 22.02 kg/m.  Wt Readings from Last 3 Encounters:  10/18/19 109 lb (49.4 kg)  04/17/19 108 lb (49 kg)  10/16/18 103 lb (46.7 kg)    General: Patient appears comfortable at rest. HEENT: Conjunctiva and lids normal, wearing a mask. Neck: Supple, no elevated JVP or carotid bruits, no thyromegaly. Lungs: Clear to auscultation, nonlabored breathing at rest. Cardiac: Regular rate and rhythm, no S3 or significant systolic murmur, no pericardial rub. Extremities: No pitting edema, distal pulses 2+.  ECG:  An ECG dated 10/16/2018 was personally reviewed today and demonstrated:  Sinus rhythm with nonspecific ST changes.  Recent Labwork:  February 2021: BUN 16, creatinine 1.15, potassium 4.7, AST 13, ALT 12, cholesterol 162, HDL 69, triglycerides  84, LDL 77, hemoglobin 13.1, platelets 245  Other Studies Reviewed Today:  Lexiscan Myoview 06/02/2015:  There was no ST segment deviation noted during stress.  The study is normal. There are no perfusion defects consistent with prior infarct or current ischemia.  This is a low risk study.  The left ventricular ejection fraction is hyperdynamic (>65%).  Assessment and Plan:  1.  CAD status post DES to the circumflex and LAD in 2011.  She continues to do well without active angina.  ECG reviewed.  Continue aspirin, Toprol-XL, Zocor, and as needed nitroglycerin.  Refills provided.  2.  Mixed hyperlipidemia, continue Zocor.  Last LDL was  77.  Medication Adjustments/Labs and Tests Ordered: Current medicines are reviewed at length with the patient today.  Concerns regarding medicines are outlined above.   Tests Ordered: Orders Placed This Encounter  Procedures  . EKG 12-Lead    Medication Changes: No orders of the defined types were placed in this encounter.   Disposition:  Follow up 6 months in the [place] office.  Signed, Jonelle Sidle, MD, [facility] 10/18/2019 4:08 PM    [facility] at [facility] [address] [place], [place], [place] [address] Phone: [phone]; Fax: [phone]

## 2019-10-26 ENCOUNTER — Other Ambulatory Visit (HOSPITAL_COMMUNITY): Payer: Self-pay | Admitting: Nurse Practitioner

## 2019-10-26 DIAGNOSIS — Z1231 Encounter for screening mammogram for malignant neoplasm of breast: Secondary | ICD-10-CM

## 2019-12-07 ENCOUNTER — Other Ambulatory Visit: Payer: Self-pay

## 2019-12-07 ENCOUNTER — Ambulatory Visit (HOSPITAL_COMMUNITY)
Admission: RE | Admit: 2019-12-07 | Discharge: 2019-12-07 | Disposition: A | Discharge status: Home / Self Care | Payer: Medicare Other | Source: Ambulatory Visit | Attending: Nurse Practitioner | Admitting: Nurse Practitioner

## 2019-12-07 DIAGNOSIS — Z1231 Encounter for screening mammogram for malignant neoplasm of breast: Secondary | ICD-10-CM

## 2020-04-24 ENCOUNTER — Ambulatory Visit: Payer: Medicare Other | Admitting: Cardiology

## 2020-04-24 ENCOUNTER — Encounter: Payer: Self-pay | Admitting: Cardiology

## 2020-04-24 VITALS — BP 166/80 | HR 70 | Ht 59.0 in | Wt 123.0 lb

## 2020-04-24 DIAGNOSIS — I25119 Atherosclerotic heart disease of native coronary artery with unspecified angina pectoris: Secondary | ICD-10-CM | POA: Diagnosis not present

## 2020-04-24 DIAGNOSIS — E782 Mixed hyperlipidemia: Secondary | ICD-10-CM | POA: Diagnosis not present

## 2020-04-24 NOTE — Progress Notes (Signed)
Cardiology Office Note  Date: 04/24/2020   ID: Vanessa Fritz, Vanessa Fritz [birth year], MRN [id number]  PCP:  Erasmo Downer, NP  Cardiologist:  Nona Dell, MD Electrophysiologist:  None   Chief Complaint  Patient presents with  . Cardiac follow-up    History of Present Illness: Vanessa Fritz is a 75 y.o. female last seen in August 2021.  She presents for a routine visit.  Reports no active angina symptoms or nitroglycerin use since last encounter.  I reviewed her medications, she states that she has been compliant with therapy, no obvious intolerances.  She will be having lab work with her PCP this week.  Blood pressure was elevated today, I asked her to check her blood pressure at home with automatic cuff in case additional adjustments are needed.  Past Medical History:  Diagnosis Date  . Coronary atherosclerosis of native coronary artery    Multivessel, LVEF 65%, DES circumflex and LAD 8/11  . Hyperlipidemia   . Myocardial infarction [facility])     Past Surgical History:  Procedure Laterality Date  . COLONOSCOPY  05/10/2011   Procedure: COLONOSCOPY;  Surgeon: Corbin Ade, MD;  [facility];  Service: Endoscopy;  Laterality: N/A;  8:15 AM  . HERNIA REPAIR    . TUBAL LIGATION      Current Outpatient Medications  Medication Sig Dispense Refill  . acetaminophen (TYLENOL) 650 MG CR tablet Take 650 mg by mouth every 4 (four) hours as needed. For headaches    . aspirin EC 81 MG tablet Take 81 mg by mouth daily.    . cholecalciferol (VITAMIN D) 1000 UNITS tablet Take 1,000 Units by mouth daily.     . metoprolol succinate (TOPROL-XL) 25 MG 24 hr tablet Take 1 tablet (25 mg total) by mouth daily. 90 tablet 3  . Multiple Vitamin (MULTIVITAMIN WITH MINERALS) TABS Take 1 tablet by mouth daily.    . nitroGLYCERIN (NITROSTAT) 0.4 MG SL tablet Place 1 tablet (0.4 mg total) under the tongue every 5 (five) minutes x 3 doses as needed for chest pain (if no relief after  3rd dose, proceed to the ED for an evaluation or call 911). 25 tablet 3  . simvastatin (ZOCOR) 20 MG tablet Take 1 tablet (20 mg total) by mouth at bedtime. 90 tablet 3  . vitamin B-12 (CYANOCOBALAMIN) 1000 MCG tablet Take 1,000 mcg by mouth daily.    . vitamin C (ASCORBIC ACID) 500 MG tablet Take 500 mg by mouth daily.     No current facility-administered medications for this visit.   Allergies:  Sulfonamide derivatives   ROS: No palpitations or syncope.  Physical Exam: VS:  BP (!) 166/80   Pulse 70   Ht 4\' 11"  (1.499 m)   Wt 123 lb (55.8 kg)   SpO2 98%   BMI 24.84 kg/m , BMI Body mass index is 24.84 kg/m.  Wt Readings from Last 3 Encounters:  04/24/20 123 lb (55.8 kg)  10/18/19 109 lb (49.4 kg)  04/17/19 108 lb (49 kg)    General: Patient appears comfortable at rest. HEENT: Conjunctiva and lids normal, wearing a mask. Neck: Supple, no elevated JVP or carotid bruits, no thyromegaly. Lungs: Clear to auscultation, nonlabored breathing at rest. Cardiac: Regular rate and rhythm, no S3 or significant systolic murmur, no pericardial rub. Extremities: No pitting edema.  ECG:  An ECG dated 10/18/2019 was personally reviewed today and demonstrated:  Sinus rhythm with lead motion artifact.  Recent Labwork:  August  2021: Cholesterol 149, triglycerides 164, HDL 64, LDL 61, BUN 12, creatinine 1.1, potassium 4.8, AST 15, ALT 11, hemoglobin 13.7, platelets 289  Other Studies Reviewed Today:  Lexiscan Myoview 06/02/2015:  There was no ST segment deviation noted during stress.  The study is normal. There are no perfusion defects consistent with prior infarct or current ischemia.  This is a low risk study.  The left ventricular ejection fraction is hyperdynamic (>65%).  Assessment and Plan:  1.  Symptomatically stable CAD status post DES to the circumflex and LAD in 2011.  Last ischemic testing was via Myoview in 2017, low risk at that time.  She reports no angina symptoms on medical  therapy and remains comfortable with observation.  Continue aspirin, Toprol-XL, and Zocor.  As needed nitroglycerin is available.  2.  Mixed hyperlipidemia, tolerating Zocor.  She is due for follow-up lab work with PCP soon.  3.  Elevated blood pressure.  I asked her to track blood pressure intermittently with home automatic cuff.  Medication Adjustments/Labs and Tests Ordered: Current medicines are reviewed at length with the patient today.  Concerns regarding medicines are outlined above.   Tests Ordered: No orders of the defined types were placed in this encounter.   Medication Changes: No orders of the defined types were placed in this encounter.   Disposition:  Follow up 6 months in the [place] office.  Signed, Jonelle Sidle, MD, [facility] 04/24/2020 2:11 PM    [facility] at [facility] [address] [place], [place], [place] [address] Phone: [phone]; Fax: [phone]

## 2020-04-24 NOTE — Patient Instructions (Signed)

## 2020-05-14 ENCOUNTER — Other Ambulatory Visit (HOSPITAL_COMMUNITY): Payer: Self-pay | Admitting: Nurse Practitioner

## 2020-05-14 DIAGNOSIS — M810 Age-related osteoporosis without current pathological fracture: Secondary | ICD-10-CM

## 2020-05-30 ENCOUNTER — Other Ambulatory Visit: Payer: Self-pay

## 2020-05-30 ENCOUNTER — Ambulatory Visit (HOSPITAL_COMMUNITY)
Admission: RE | Admit: 2020-05-30 | Discharge: 2020-05-30 | Disposition: A | Discharge status: Home / Self Care | Payer: Medicare Other | Source: Ambulatory Visit | Attending: Nurse Practitioner | Admitting: Nurse Practitioner

## 2020-05-30 DIAGNOSIS — M810 Age-related osteoporosis without current pathological fracture: Secondary | ICD-10-CM | POA: Insufficient documentation

## 2020-08-14 ENCOUNTER — Other Ambulatory Visit: Payer: Self-pay | Admitting: Cardiology

## 2020-11-04 ENCOUNTER — Other Ambulatory Visit (HOSPITAL_COMMUNITY): Payer: Self-pay | Admitting: Nurse Practitioner

## 2020-11-04 DIAGNOSIS — Z1231 Encounter for screening mammogram for malignant neoplasm of breast: Secondary | ICD-10-CM

## 2020-11-05 ENCOUNTER — Ambulatory Visit: Payer: Medicare Other | Admitting: Cardiology

## 2020-11-05 ENCOUNTER — Encounter: Payer: Self-pay | Admitting: Cardiology

## 2020-11-05 VITALS — BP 146/60 | HR 70 | Ht 59.0 in | Wt 111.2 lb

## 2020-11-05 DIAGNOSIS — E782 Mixed hyperlipidemia: Secondary | ICD-10-CM

## 2020-11-05 DIAGNOSIS — I25119 Atherosclerotic heart disease of native coronary artery with unspecified angina pectoris: Secondary | ICD-10-CM | POA: Diagnosis not present

## 2020-11-05 MED ORDER — NITROGLYCERIN 0.4 MG SL SUBL: 0.4000 mg | SUBLINGUAL_TABLET | SUBLINGUAL | 3 refills | Status: AC | PRN

## 2020-11-05 MED ORDER — SIMVASTATIN 20 MG PO TABS: 20.0000 mg | ORAL_TABLET | Freq: Every day | ORAL | 3 refills | Status: DC

## 2020-11-05 MED ORDER — ISOSORBIDE MONONITRATE ER 30 MG PO TB24: 15.0000 mg | ORAL_TABLET | Freq: Every evening | ORAL | 2 refills | Status: DC

## 2020-11-05 MED ORDER — METOPROLOL SUCCINATE ER 25 MG PO TB24: 25.0000 mg | ORAL_TABLET | Freq: Every day | ORAL | 3 refills | Status: DC

## 2020-11-05 NOTE — Patient Instructions (Signed)
Medication Instructions:  Your physician has recommended you make the following change in your medication:  Start isosorbide mononitrate (imdur) 15 mg daily in the evening Continue other medications the same  Labwork: none  Testing/Procedures: none  Follow-Up: Your physician recommends that you schedule a follow-up appointment in: 6 months  Any Other Special Instructions Will Be Listed Below (If Applicable).  If you need a refill on your cardiac medications before your next appointment, please call your pharmacy.

## 2020-11-05 NOTE — Progress Notes (Signed)
Cardiology Office Note  Date: 11/05/2020   ID: Vanessa Fritz, Vanessa Fritz 1945-05-26, MRN [id number]  PCP:  Erasmo Downer, NP  Cardiologist:  Nona Dell, MD Electrophysiologist:  None   Chief Complaint  Patient presents with   Cardiac follow-up    History of Present Illness: Vanessa Fritz is a 75 y.o. female last seen in February.  She is here for a follow-up visit.  Reports no angina or nitroglycerin use, but has had some exercise intolerance, particular when walking uphill.  This has been present over months.  She states that she has been taking her medications regularly otherwise.  I reviewed her current regimen which is outlined below.  She had lab work back in March at which point LDL was 64.  Past Medical History:  Diagnosis Date   Coronary atherosclerosis of native coronary artery    Multivessel, LVEF 65%, DES circumflex and LAD 8/11   Hyperlipidemia    Myocardial infarction [facility])     Past Surgical History:  Procedure Laterality Date   COLONOSCOPY  05/10/2011   Procedure: COLONOSCOPY;  Surgeon: Corbin Ade, MD;  [facility];  Service: Endoscopy;  Laterality: N/A;  8:15 AM   HERNIA REPAIR     TUBAL LIGATION      Current Outpatient Medications  Medication Sig Dispense Refill   acetaminophen (TYLENOL) 650 MG CR tablet Take 650 mg by mouth every 4 (four) hours as needed. For headaches     alendronate (FOSAMAX) 70 MG tablet Take 1 tablet by mouth once a week.     aspirin EC 81 MG tablet Take 81 mg by mouth daily.     cholecalciferol (VITAMIN D) 1000 UNITS tablet Take 1,000 Units by mouth daily.      isosorbide mononitrate (IMDUR) 30 MG 24 hr tablet Take 0.5 tablets (15 mg total) by mouth every evening. 45 tablet 2   Multiple Vitamin (MULTIVITAMIN WITH MINERALS) TABS Take 1 tablet by mouth daily.     vitamin B-12 (CYANOCOBALAMIN) 1000 MCG tablet Take 1,000 mcg by mouth daily.     vitamin C (ASCORBIC ACID) 500 MG tablet Take 500 mg by mouth daily.      metoprolol succinate (TOPROL-XL) 25 MG 24 hr tablet Take 1 tablet (25 mg total) by mouth daily. 90 tablet 3   nitroGLYCERIN (NITROSTAT) 0.4 MG SL tablet Place 1 tablet (0.4 mg total) under the tongue every 5 (five) minutes x 3 doses as needed for chest pain (if no relief after 3rd dose, proceed to the ED for an evaluation or call 911). 25 tablet 3   simvastatin (ZOCOR) 20 MG tablet Take 1 tablet (20 mg total) by mouth at bedtime. 90 tablet 3   No current facility-administered medications for this visit.   Allergies:  Sulfonamide derivatives   ROS: No palpitations or syncope.  Physical Exam: VS:  BP (!) 146/60   Pulse 70   Ht 4\' 11"  (1.499 m)   Wt 111 lb 3.2 oz (50.4 kg)   SpO2 98%   BMI 22.46 kg/m , BMI Body mass index is 22.46 kg/m.  Wt Readings from Last 3 Encounters:  11/05/20 111 lb 3.2 oz (50.4 kg)  04/24/20 123 lb (55.8 kg)  10/18/19 109 lb (49.4 kg)    General: Patient appears comfortable at rest. HEENT: Conjunctiva and lids normal, wearing a mask. Neck: Supple, no elevated JVP or carotid bruits, no thyromegaly. Lungs: Clear to auscultation, nonlabored breathing at rest. Cardiac: Regular rate and rhythm,  no S3 or significant systolic murmur. Extremities: No pitting edema.  ECG:  An ECG dated 10/18/2019 was personally reviewed today and demonstrated:  Sinus rhythm with lead motion artifact.  Recent Labwork:  March 2022: Cholesterol 140, triglycerides 98, HDL 58, LDL 64, BUN 14, creatinine 1.01, potassium 4.7, AST 16, ALT 14, hemoglobin 13.5, platelets 291  Lexiscan Myoview 06/02/2015: There was no ST segment deviation noted during stress. The study is normal. There are no perfusion defects consistent with prior infarct or current ischemia. This is a low risk study. The left ventricular ejection fraction is hyperdynamic (>65%).  Assessment and Plan:  1.  CAD status post DES to the circumflex and LAD in 2011.  Last ischemic evaluation was in 2017.  She does report  exercise intolerance but no frank angina.  Plan is to continue aspirin, Toprol-XL, and Zocor.  Add Imdur 15 mg in the evening.  If symptoms progress we will plan follow-up ischemic testing.  2.  Mixed hyperlipidemia on Zocor.  Recent LDL 64.  Medication Adjustments/Labs and Tests Ordered: Current medicines are reviewed at length with the patient today.  Concerns regarding medicines are outlined above.   Tests Ordered: No orders of the defined types were placed in this encounter.   Medication Changes: Meds ordered this encounter  Medications   simvastatin (ZOCOR) 20 MG tablet    Sig: Take 1 tablet (20 mg total) by mouth at bedtime.    Dispense:  90 tablet    Refill:  3   nitroGLYCERIN (NITROSTAT) 0.4 MG SL tablet    Sig: Place 1 tablet (0.4 mg total) under the tongue every 5 (five) minutes x 3 doses as needed for chest pain (if no relief after 3rd dose, proceed to the ED for an evaluation or call 911).    Dispense:  25 tablet    Refill:  3   metoprolol succinate (TOPROL-XL) 25 MG 24 hr tablet    Sig: Take 1 tablet (25 mg total) by mouth daily.    Dispense:  90 tablet    Refill:  3   isosorbide mononitrate (IMDUR) 30 MG 24 hr tablet    Sig: Take 0.5 tablets (15 mg total) by mouth every evening.    Dispense:  45 tablet    Refill:  2    11/05/2020 NEW     Disposition:  Follow up  6 months, sooner if needed.  Signed, Jonelle Sidle, MD, [facility] 11/05/2020 2:16 PM    [facility] at [facility] [address] [place], [place], [place] [address] Phone: [phone]; Fax: [phone]

## 2020-12-08 ENCOUNTER — Ambulatory Visit (HOSPITAL_COMMUNITY)
Admission: RE | Admit: 2020-12-08 | Discharge: 2020-12-08 | Disposition: A | Discharge status: Home / Self Care | Payer: Medicare Other | Source: Ambulatory Visit | Attending: Nurse Practitioner | Admitting: Nurse Practitioner

## 2020-12-08 ENCOUNTER — Other Ambulatory Visit: Payer: Self-pay

## 2020-12-08 DIAGNOSIS — Z1231 Encounter for screening mammogram for malignant neoplasm of breast: Secondary | ICD-10-CM | POA: Insufficient documentation

## 2021-05-05 ENCOUNTER — Encounter: Payer: Self-pay | Admitting: *Deleted

## 2021-05-14 ENCOUNTER — Other Ambulatory Visit: Payer: Self-pay | Admitting: Cardiology

## 2021-05-29 ENCOUNTER — Ambulatory Visit: Payer: Medicare Other | Admitting: Cardiology

## 2021-05-29 ENCOUNTER — Encounter: Payer: Self-pay | Admitting: Cardiology

## 2021-05-29 VITALS — BP 140/70 | HR 74 | Ht 59.0 in | Wt 107.6 lb

## 2021-05-29 DIAGNOSIS — E782 Mixed hyperlipidemia: Secondary | ICD-10-CM

## 2021-05-29 DIAGNOSIS — I25119 Atherosclerotic heart disease of native coronary artery with unspecified angina pectoris: Secondary | ICD-10-CM | POA: Diagnosis not present

## 2021-05-29 NOTE — Patient Instructions (Addendum)

## 2021-05-29 NOTE — Progress Notes (Signed)
? ? ?Cardiology Office Note ? ?Date: 05/29/2021  ? ?ID: Vanessa Fritz, DOB September 18, 1945, MRN [id number] ? ?PCP:  Erasmo Downer, NP  ?Cardiologist:  Nona Dell, MD ?Electrophysiologist:  None  ? ?Chief Complaint  ?Patient presents with  ? Cardiac follow-up  ? ? ?History of Present Illness: ?Vanessa Fritz is a 76 y.o. female last seen in August 2022.  She is here for a routine follow-up visit.  She does not report any active angina or nitroglycerin use, doing well with ADLs and other outdoor activities.  She reports NYHA class I-II dyspnea, no palpitations or syncope. ? ?I personally reviewed her ECG today which is normal. ? ?We went over her medications which are unchanged from a cardiac perspective.  She had recent lab work with PCP which I also reviewed. ? ?Past Medical History:  ?Diagnosis Date  ? Coronary atherosclerosis of native coronary artery   ? Multivessel, LVEF 65%, DES circumflex and LAD 8/11  ? Hyperlipidemia   ? Myocardial infarction [facility])   ? ? ?Past Surgical History:  ?Procedure Laterality Date  ? COLONOSCOPY  05/10/2011  ? Procedure: COLONOSCOPY;  Surgeon: Corbin Ade, MD;  [facility];  Service: Endoscopy;  Laterality: N/A;  8:15 AM  ? HERNIA REPAIR    ? TUBAL LIGATION    ? ? ?Current Outpatient Medications  ?Medication Sig Dispense Refill  ? acetaminophen (TYLENOL) 650 MG CR tablet Take 650 mg by mouth every 4 (four) hours as needed. For headaches    ? alendronate (FOSAMAX) 70 MG tablet Take 1 tablet by mouth once a week.    ? aspirin EC 81 MG tablet Take 81 mg by mouth daily.    ? cholecalciferol (VITAMIN D) 1000 UNITS tablet Take 1,000 Units by mouth daily.     ? isosorbide mononitrate (IMDUR) 30 MG 24 hr tablet TAKE 1/2 TABLET IN THE EVENING 45 tablet 2  ? metoprolol succinate (TOPROL-XL) 25 MG 24 hr tablet Take 1 tablet (25 mg total) by mouth daily. 90 tablet 3  ? Multiple Vitamin (MULTIVITAMIN WITH MINERALS) TABS Take 1 tablet by mouth daily.    ? nitroGLYCERIN  (NITROSTAT) 0.4 MG SL tablet Place 1 tablet (0.4 mg total) under the tongue every 5 (five) minutes x 3 doses as needed for chest pain (if no relief after 3rd dose, proceed to the ED for an evaluation or call 911). 25 tablet 3  ? simvastatin (ZOCOR) 20 MG tablet Take 1 tablet (20 mg total) by mouth at bedtime. 90 tablet 3  ? vitamin B-12 (CYANOCOBALAMIN) 1000 MCG tablet Take 1,000 mcg by mouth daily.    ? vitamin C (ASCORBIC ACID) 500 MG tablet Take 500 mg by mouth daily.    ? ?No current facility-administered medications for this visit.  ? ?Allergies:  Sulfonamide derivatives  ? ?ROS: No orthopnea or PND. ? ?Physical Exam: ?VS:  BP 140/70   Pulse 74   Ht 4\' 11"  (1.499 m)   Wt 107 lb 9.6 oz (48.8 kg)   SpO2 98%   BMI 21.73 kg/m? , BMI Body mass index is 21.73 kg/m?. ? ?Wt Readings from Last 3 Encounters:  ?05/29/21 107 lb 9.6 oz (48.8 kg)  ?11/05/20 111 lb 3.2 oz (50.4 kg)  ?04/24/20 123 lb (55.8 kg)  ?  ?General: Patient appears comfortable at rest. ?HEENT: Conjunctiva and lids normal, wearing a mask. ?Neck: Supple, no elevated JVP or carotid bruits, no thyromegaly. ?Lungs: Clear to auscultation, nonlabored breathing at rest. ?Cardiac:  Regular rate and rhythm, no S3 or significant systolic murmur, no pericardial rub. ?Extremities: No pitting edema. ? ?ECG:  An ECG dated 10/18/2019 was personally reviewed today and demonstrated:  Sinus rhythm with lead motion artifact. ? ?Recent Labwork: ? ?March 2023: Cholesterol 166, triglycerides 109, HDL 62, LDL 83, BUN 13, creatinine 1.08, potassium 4.5, AST 16, ALT 12, hemoglobin 13.3, platelets 290 ? ?Other Studies Reviewed Today: ? ?Lexiscan Myoview 06/02/2015: ?There was no ST segment deviation noted during stress. ?The study is normal. There are no perfusion defects consistent with prior infarct or current ischemia. ?This is a low risk study. ?The left ventricular ejection fraction is hyperdynamic (>65%). ? ?Assessment and Plan: ? ?1.  CAD status post DES to circumflex  and LAD in 2011.  She continues to do well without angina or nitroglycerin use.  ECG is normal today.  Plan to continue observation unless symptoms intervene.  Currently on aspirin, Toprol-XL, Imdur, Zocor, and as needed nitroglycerin. ? ?2.  Mixed hyperlipidemia on Zocor.  Last LDL was 83. ? ?Medication Adjustments/Labs and Tests Ordered: ?Current medicines are reviewed at length with the patient today.  Concerns regarding medicines are outlined above.  ? ?Tests Ordered: ?Orders Placed This Encounter  ?Procedures  ? EKG 12-Lead  ? ? ?Medication Changes: ?No orders of the defined types were placed in this encounter. ? ? ?Disposition:  Follow up  6 months. ? ?Signed, ?Jonelle Sidle, MD, [facility] ?05/29/2021 4:16 PM    ?[facility] ?[address] [place], [place], [place] [address] ?Phone: [phone]; Fax: [phone]  ?

## 2021-08-19 ENCOUNTER — Other Ambulatory Visit: Payer: Self-pay | Admitting: Cardiology

## 2021-10-22 ENCOUNTER — Other Ambulatory Visit (HOSPITAL_COMMUNITY): Payer: Self-pay | Admitting: Nurse Practitioner

## 2021-10-22 DIAGNOSIS — Z1231 Encounter for screening mammogram for malignant neoplasm of breast: Secondary | ICD-10-CM

## 2021-10-28 IMAGING — MG MM DIGITAL SCREENING BILAT W/ TOMO AND CAD
6 of 10 series · 6 of 30 positions shown · non-contrast
Comparison: Previous exam(s).

CLINICAL DATA: Screening.

EXAM:
DIGITAL SCREENING BILATERAL MAMMOGRAM WITH TOMOSYNTHESIS AND CAD
TECHNIQUE: Bilateral screening digital craniocaudal and mediolateral oblique
mammograms were obtained. Bilateral screening digital breast
tomosynthesis was performed. The images were evaluated with
computer-aided detection.

[R CC synth-2D]
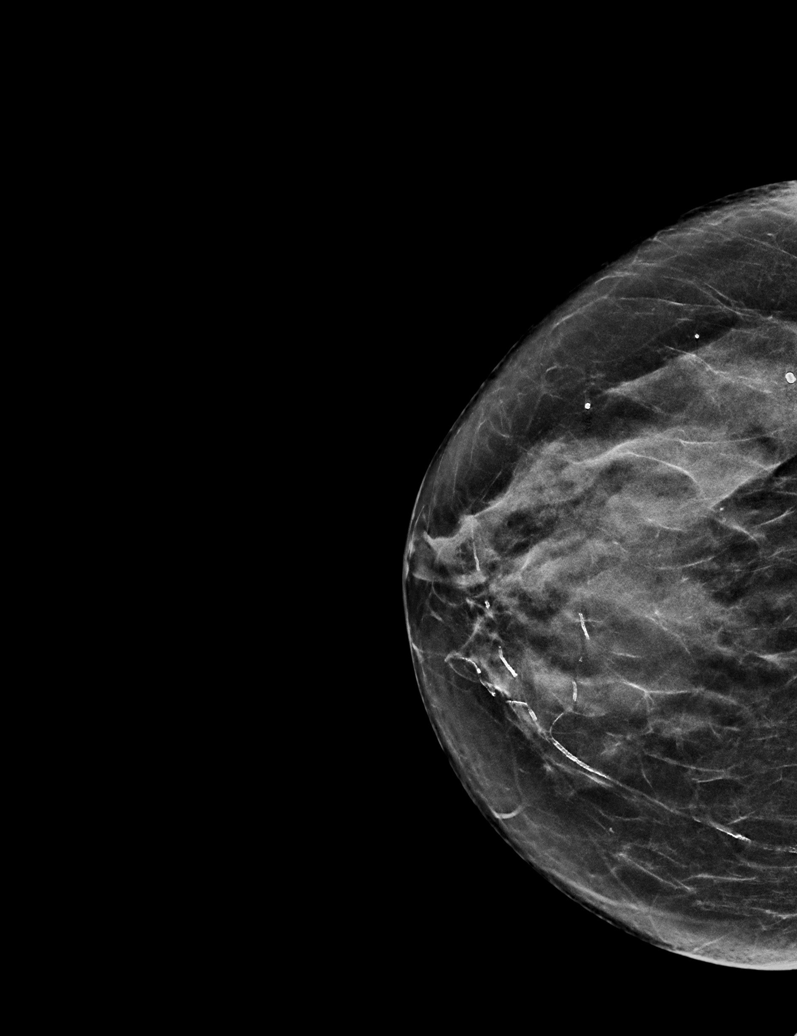

[R MLO synth-2D]
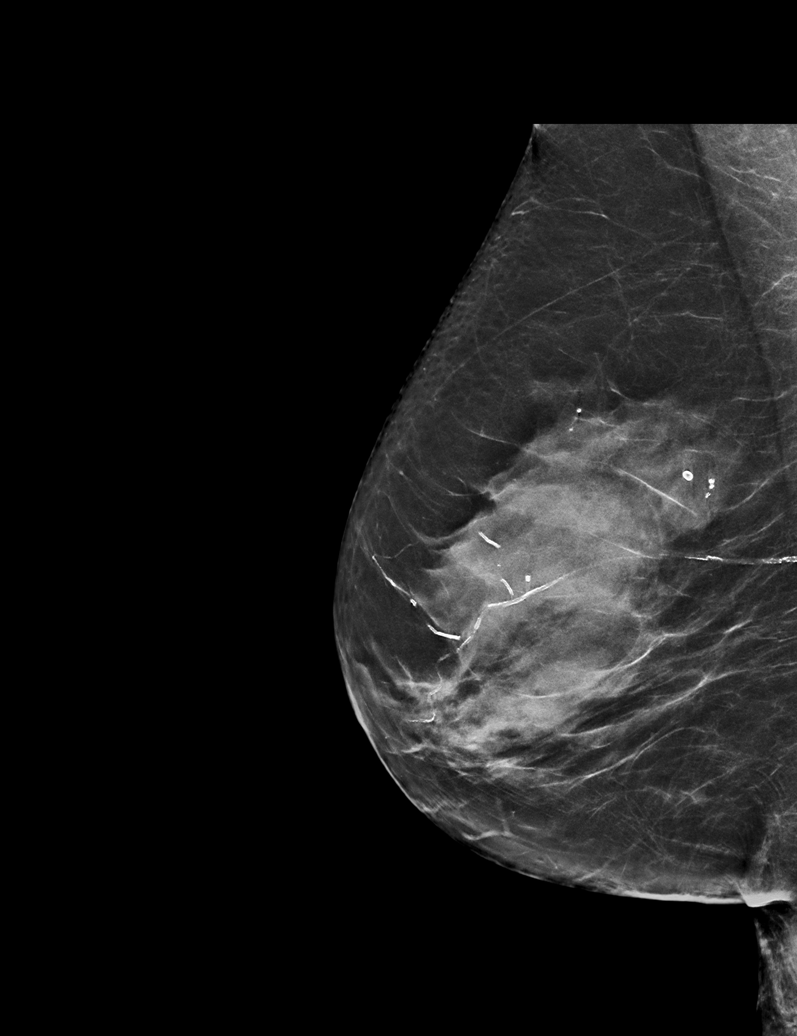

[L CC synth-2D]
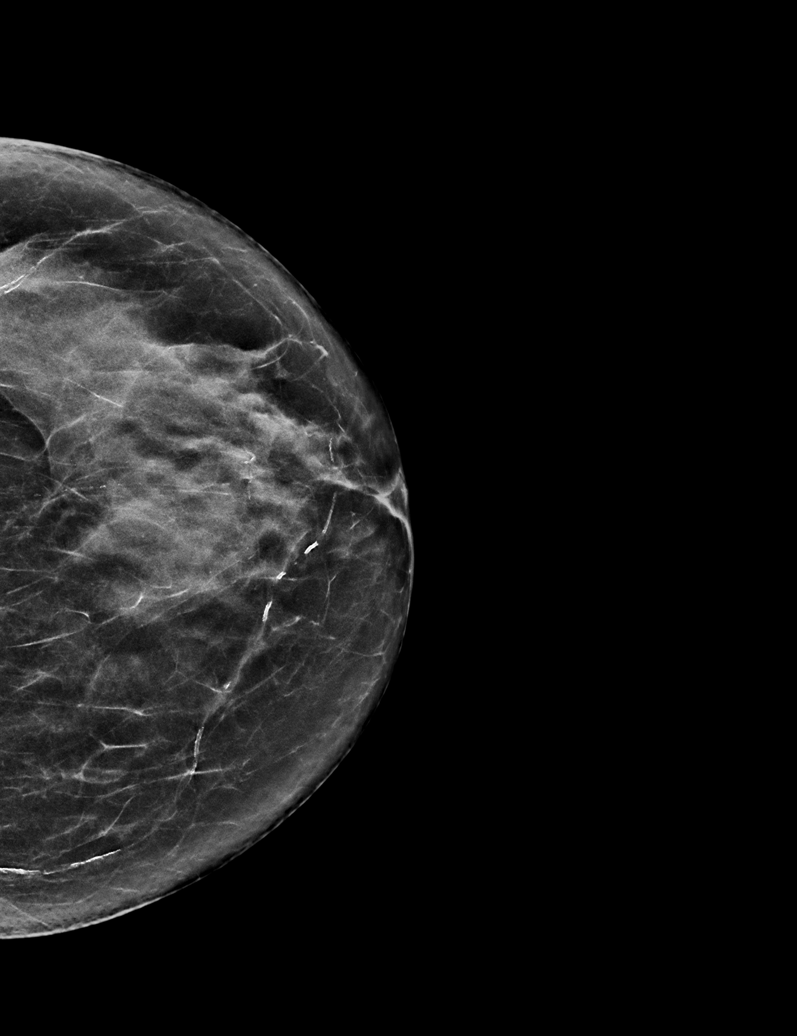

[L MLO synth-2D (1 of 2)]
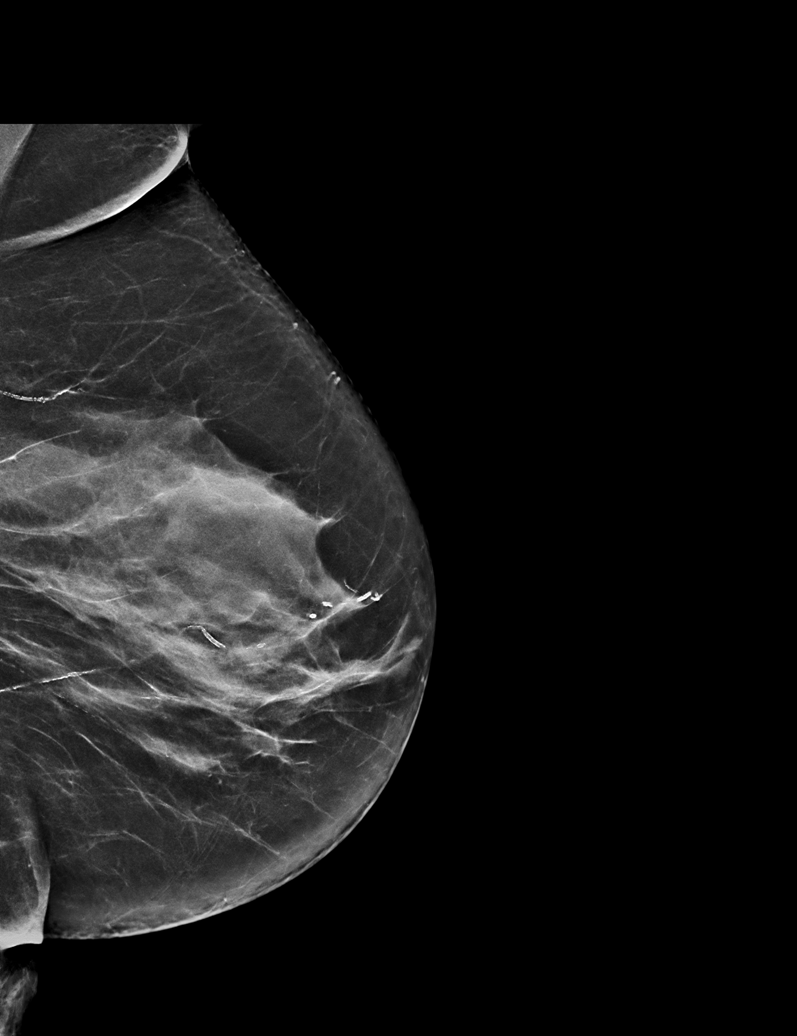

[L MLO synth-2D (2 of 2)]
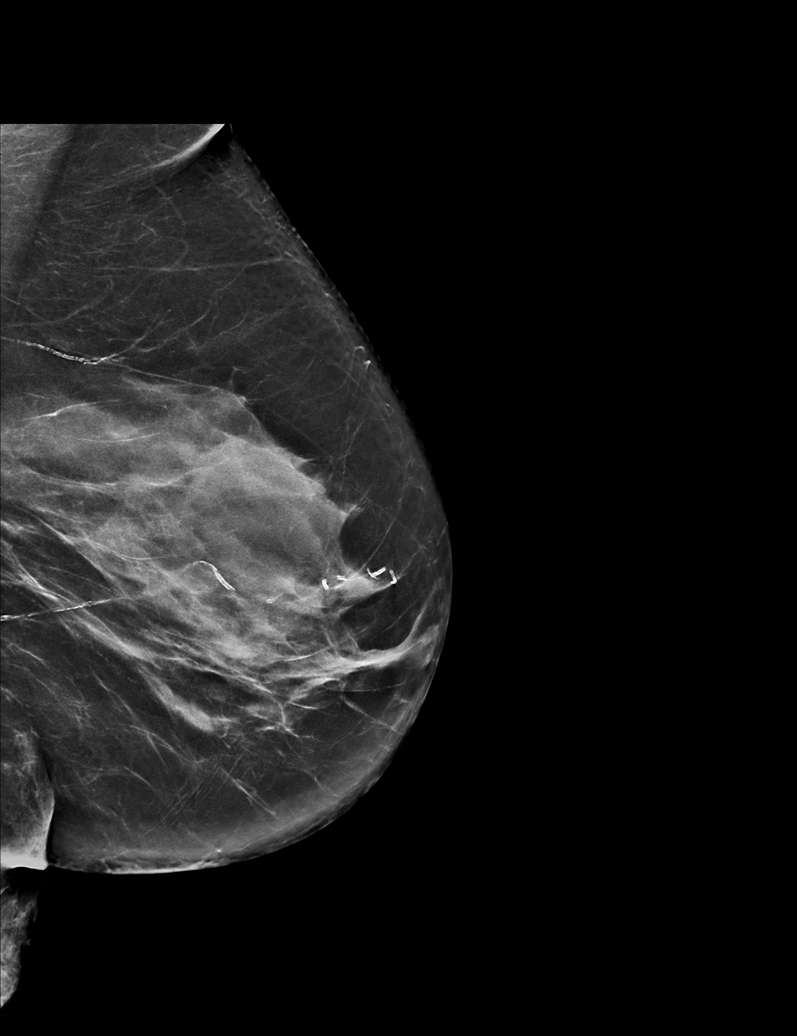

[L MLO tomo · tomo slice 32/63.0]
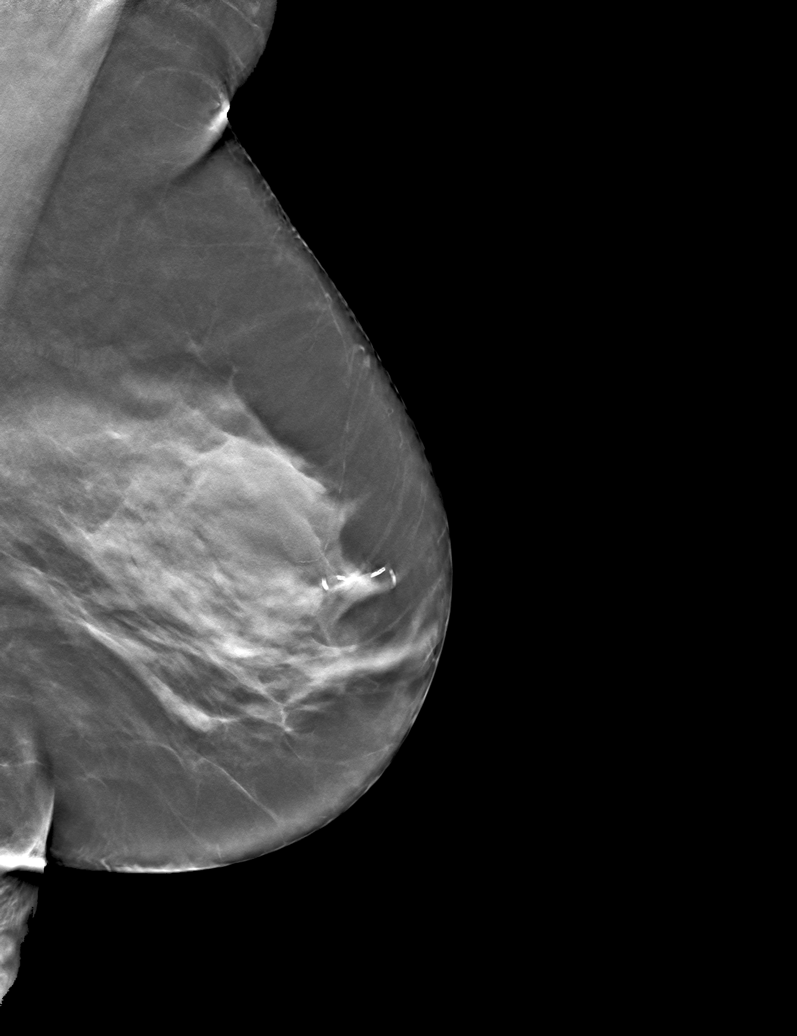

[6 of 30 positions shown; findings below may reference images not displayed]

ACR Breast Density Category c: The breast tissue is heterogeneously
dense, which may obscure small masses.
FINDINGS: There are no findings suspicious for malignancy.
IMPRESSION: No mammographic evidence of malignancy. A result letter of this
screening mammogram will be mailed directly to the patient.

RECOMMENDATION:
Screening mammogram in one year. (Code:Q3-W-BC3)

BI-RADS CATEGORY  1: Negative.

## 2021-12-02 ENCOUNTER — Encounter: Payer: Self-pay | Admitting: Cardiology

## 2021-12-02 ENCOUNTER — Ambulatory Visit: Payer: Medicare Other | Attending: Cardiology | Admitting: Cardiology

## 2021-12-02 VITALS — BP 118/78 | HR 70 | Ht 59.0 in | Wt 111.6 lb

## 2021-12-02 DIAGNOSIS — E782 Mixed hyperlipidemia: Secondary | ICD-10-CM

## 2021-12-02 DIAGNOSIS — I25119 Atherosclerotic heart disease of native coronary artery with unspecified angina pectoris: Secondary | ICD-10-CM

## 2021-12-02 NOTE — Progress Notes (Signed)
Cardiology Office Note  Date: 12/02/2021   ID: Vanessa, Fritz [birth year], MRN [id number]  PCP:  Renee Rival, NP  Cardiologist:  Rozann Lesches, MD Electrophysiologist:  None   Chief Complaint  Patient presents with   Cardiac follow-up    History of Present Illness: Vanessa Fritz is a 76 y.o. female last seen in March.  She is here for a follow-up visit.  She does not report any angina or nitroglycerin use, just got a refill for fresh bottle.  She has been working in her garden/flowers.  NYHA class II dyspnea.  No palpitations or syncope.  I reviewed her medications which are stable from a cardiac perspective.  She continues to follow with PCP, I reviewed her lab work from March.  She states that she has been compliant with medical therapy.  Past Medical History:  Diagnosis Date   Coronary atherosclerosis of native coronary artery    Multivessel, LVEF 65%, DES circumflex and LAD 8/11   Hyperlipidemia    Myocardial infarction [facility])     Past Surgical History:  Procedure Laterality Date   COLONOSCOPY  05/10/2011   Procedure: COLONOSCOPY;  Surgeon: Daneil Dolin, MD;  [facility];  Service: Endoscopy;  Laterality: N/A;  8:15 AM   HERNIA REPAIR     TUBAL LIGATION      Current Outpatient Medications  Medication Sig Dispense Refill   acetaminophen (TYLENOL) 650 MG CR tablet Take 650 mg by mouth every 4 (four) hours as needed. For headaches     alendronate (FOSAMAX) 70 MG tablet Take 1 tablet by mouth once a week.     aspirin EC 81 MG tablet Take 81 mg by mouth daily.     cholecalciferol (VITAMIN D) 1000 UNITS tablet Take 1,000 Units by mouth daily.      isosorbide mononitrate (IMDUR) 30 MG 24 hr tablet TAKE 1/2 TABLET IN THE EVENING 45 tablet 2   metoprolol succinate (TOPROL-XL) 25 MG 24 hr tablet Take 1 tablet (25 mg total) by mouth daily. 90 tablet 3   Multiple Vitamin (MULTIVITAMIN WITH MINERALS) TABS Take 1 tablet by mouth daily.      nitroGLYCERIN (NITROSTAT) 0.4 MG SL tablet Place 1 tablet (0.4 mg total) under the tongue every 5 (five) minutes x 3 doses as needed for chest pain (if no relief after 3rd dose, proceed to the ED for an evaluation or call 911). 25 tablet 3   simvastatin (ZOCOR) 20 MG tablet Take 1 tablet (20 mg total) by mouth at bedtime. 90 tablet 3   vitamin B-12 (CYANOCOBALAMIN) 1000 MCG tablet Take 1,000 mcg by mouth daily.     vitamin C (ASCORBIC ACID) 500 MG tablet Take 500 mg by mouth daily.     No current facility-administered medications for this visit.   Allergies:  Sulfonamide derivatives   ROS: No orthopnea or PND.  Physical Exam: VS:  BP 118/78 (BP Location: Left Arm, Patient Position: Sitting, Cuff Size: Small)   Pulse 70   Ht 4\' 11"  (1.499 m)   Wt 111 lb 9.6 oz (50.6 kg)   SpO2 99%   BMI 22.54 kg/m , BMI Body mass index is 22.54 kg/m.  Wt Readings from Last 3 Encounters:  12/02/21 111 lb 9.6 oz (50.6 kg)  05/29/21 107 lb 9.6 oz (48.8 kg)  11/05/20 111 lb 3.2 oz (50.4 kg)    General: Patient appears comfortable at rest. HEENT: Conjunctiva and lids normal. Neck: Supple, no elevated JVP  or carotid bruits. Lungs: Clear to auscultation, nonlabored breathing at rest. Cardiac: Regular rate and rhythm, no S3 or significant systolic murmur. Extremities: No pitting edema.  ECG:  An ECG dated 05/29/2021 was personally reviewed today and demonstrated:  Sinus rhythm.  Recent Labwork:  March 2023: Cholesterol 166, triglycerides 109, HDL 62, LDL 83, BUN 13, creatinine 1.08, potassium 4.5, AST 16, ALT 12, hemoglobin 13.3, platelets 290  Other Studies Reviewed Today:  Lexiscan Myoview 06/02/2015: There was no ST segment deviation noted during stress. The study is normal. There are no perfusion defects consistent with prior infarct or current ischemia. This is a low risk study. The left ventricular ejection fraction is hyperdynamic (>65%).  Assessment and Plan:  1.  CAD status post DES to  the circumflex and LAD in 2011.  She is doing well without angina or nitroglycerin use.  We have discussed warning signs and symptoms that would prompt further evaluation.  For now continue observation on aspirin, Toprol-XL, Imdur, Zocor, and as needed nitroglycerin.  2.  Mixed hyperlipidemia, continues on Zocor with last LDL 83.  Medication Adjustments/Labs and Tests Ordered: Current medicines are reviewed at length with the patient today.  Concerns regarding medicines are outlined above.   Tests Ordered: No orders of the defined types were placed in this encounter.   Medication Changes: No orders of the defined types were placed in this encounter.   Disposition:  Follow up  6 months.  Signed, Satira Sark, MD, [facility] 12/02/2021 2:21 PM    [place] [facility] [place], [place], [place] [address] Phone: [phone]; Fax: [phone]

## 2021-12-02 NOTE — Patient Instructions (Addendum)

## 2021-12-11 ENCOUNTER — Ambulatory Visit (HOSPITAL_COMMUNITY)
Admission: RE | Admit: 2021-12-11 | Discharge: 2021-12-11 | Disposition: A | Discharge status: Home / Self Care | Payer: Medicare Other | Source: Ambulatory Visit | Attending: Nurse Practitioner | Admitting: Nurse Practitioner

## 2021-12-11 DIAGNOSIS — Z1231 Encounter for screening mammogram for malignant neoplasm of breast: Secondary | ICD-10-CM | POA: Insufficient documentation

## 2022-05-18 ENCOUNTER — Other Ambulatory Visit: Payer: Self-pay | Admitting: Cardiology

## 2022-07-14 ENCOUNTER — Encounter: Payer: Self-pay | Admitting: Cardiology

## 2022-07-14 ENCOUNTER — Ambulatory Visit: Payer: Medicare Other | Attending: Cardiology | Admitting: Cardiology

## 2022-07-14 VITALS — BP 124/52 | HR 89 | Ht 59.5 in | Wt 107.0 lb

## 2022-07-14 DIAGNOSIS — I25119 Atherosclerotic heart disease of native coronary artery with unspecified angina pectoris: Secondary | ICD-10-CM

## 2022-07-14 DIAGNOSIS — E782 Mixed hyperlipidemia: Secondary | ICD-10-CM | POA: Diagnosis not present

## 2022-07-14 NOTE — Patient Instructions (Signed)

## 2022-07-14 NOTE — Progress Notes (Signed)
    Cardiology Office Note  Date: 07/14/2022   ID: Shadavia, Dampier [birth year], MRN [id number]  History of Present Illness: Vanessa Fritz is a 77 y.o. female last seen in September 2023.  She is here for a routine visit.  Continues to do well without angina or nitroglycerin use.  Remains active with indoor and outdoor chores as tolerated.  No palpitations or syncope.  NYHA class II dyspnea.  I reviewed her medications, she reports compliance with therapy.  LDL has come down further to 64 on Zocor.  ECG today shows normal sinus rhythm with nonspecific ST changes.  Physical Exam: VS:  BP (!) 124/52   Pulse 89   Ht 4' 11.5" (1.511 m)   Wt 107 lb (48.5 kg)   SpO2 97%   BMI 21.25 kg/m , BMI Body mass index is 21.25 kg/m.  Wt Readings from Last 3 Encounters:  07/14/22 107 lb (48.5 kg)  12/02/21 111 lb 9.6 oz (50.6 kg)  05/29/21 107 lb 9.6 oz (48.8 kg)    General: Patient appears comfortable at rest. HEENT: Conjunctiva and lids normal. Neck: Supple, no elevated JVP or carotid bruits. Lungs: Clear to auscultation, nonlabored breathing at rest. Cardiac: Regular rate and rhythm, no S3 or significant systolic murmur. Extremities: No pitting edema.  ECG:  An ECG dated 05/29/2021 was personally reviewed today and demonstrated:  Sinus rhythm.  Labwork:  March 2024: Cholesterol 156, triglycerides 152, HDL 69, LDL 64, BUN 12, creatinine 1.06, potassium 5.1, AST 13, ALT 11, hemoglobin 14.1, platelets 290  Other Studies Reviewed Today:  No interval cardiac testing for review today.  Assessment and Plan:  1.  CAD status post DES to the circumflex and LAD in 2011.  Follow-up Lexiscan Myoview in 2017 demonstrated no evidence of scar or ischemia with normal LVEF.  She reports no angina with stable NYHA class II dyspnea on medical therapy.  We have discussed warning signs and symptoms that would prompt further evaluation, for now we will continue observation.  She is on aspirin, Imdur,  Toprol-XL, Zocor, and as needed nitroglycerin.  2.  Mixed hyperlipidemia.  Continue Zocor, recent LDL 64.  Disposition:  Follow up  6 months.  Signed, Jonelle Sidle, M.D., F.A.C.C. [facility] at [facility]

## 2022-08-17 ENCOUNTER — Other Ambulatory Visit: Payer: Self-pay | Admitting: Cardiology

## 2022-11-03 ENCOUNTER — Other Ambulatory Visit (HOSPITAL_COMMUNITY): Payer: Self-pay | Admitting: Nurse Practitioner

## 2022-11-03 DIAGNOSIS — Z1231 Encounter for screening mammogram for malignant neoplasm of breast: Secondary | ICD-10-CM

## 2022-11-30 ENCOUNTER — Encounter: Payer: Self-pay | Admitting: Nurse Practitioner

## 2022-12-13 ENCOUNTER — Encounter (HOSPITAL_COMMUNITY): Payer: Self-pay

## 2022-12-13 ENCOUNTER — Ambulatory Visit (HOSPITAL_COMMUNITY)
Admission: RE | Admit: 2022-12-13 | Discharge: 2022-12-13 | Disposition: A | Discharge status: Home / Self Care | Payer: Medicare Other | Source: Ambulatory Visit | Attending: Nurse Practitioner | Admitting: Nurse Practitioner

## 2022-12-13 DIAGNOSIS — Z1231 Encounter for screening mammogram for malignant neoplasm of breast: Secondary | ICD-10-CM | POA: Insufficient documentation

## 2023-01-17 ENCOUNTER — Ambulatory Visit: Payer: Medicare Other | Attending: Cardiology | Admitting: Cardiology

## 2023-01-17 ENCOUNTER — Encounter: Payer: Self-pay | Admitting: Cardiology

## 2023-01-17 VITALS — BP 138/68 | HR 68 | Ht 59.5 in | Wt 111.6 lb

## 2023-01-17 DIAGNOSIS — Z79899 Other long term (current) drug therapy: Secondary | ICD-10-CM

## 2023-01-17 DIAGNOSIS — E782 Mixed hyperlipidemia: Secondary | ICD-10-CM | POA: Diagnosis not present

## 2023-01-17 DIAGNOSIS — I10 Essential (primary) hypertension: Secondary | ICD-10-CM

## 2023-01-17 DIAGNOSIS — I25119 Atherosclerotic heart disease of native coronary artery with unspecified angina pectoris: Secondary | ICD-10-CM | POA: Diagnosis not present

## 2023-01-17 MED ORDER — LOSARTAN POTASSIUM 25 MG PO TABS: 12.5000 mg | ORAL_TABLET | Freq: Every day | ORAL | 2 refills | Status: DC

## 2023-01-17 NOTE — Patient Instructions (Addendum)
Medication Instructions:  Your physician has recommended you make the following change in your medication:  Start losartan 12.5 mg daily Continue all other medications as prescribed  Labwork: BMET on 01/27/2023 Non-fasting [facility] ([address] [place])  Testing/Procedures: none  Follow-Up: Your physician recommends that you schedule a follow-up appointment in: 6 months  Any Other Special Instructions Will Be Listed Below (If Applicable).  If you need a refill on your cardiac medications before your next appointment, please call your pharmacy.

## 2023-01-17 NOTE — Progress Notes (Signed)
    Cardiology Office Note  Date: 01/17/2023   ID: [name], [name] [birth year], MRN [id number]  History of Present Illness: Vanessa Fritz is a 77 y.o. female last seen in May.  She is here for a routine visit.  Reports no angina or increasing shortness of breath with typical activities.  No palpitations or syncope.  I reviewed her medications.  Current cardiac regimen includes aspirin, Imdur, Toprol-XL, Zocor, and as needed nitroglycerin.  Blood pressure elevated today, rechecked by me at 138/68.  We discussed addition of low-dose Cozaar.  I reviewed her interval lab work which is noted below.  Physical Exam: VS:  BP 138/68 (BP Location: Right Arm)   Pulse 68   Ht 4' 11.5" (1.511 m)   Wt 111 lb 9.6 oz (50.6 kg)   SpO2 97%   BMI 22.16 kg/m , BMI Body mass index is 22.16 kg/m.  Wt Readings from Last 3 Encounters:  01/17/23 111 lb 9.6 oz (50.6 kg)  07/14/22 107 lb (48.5 kg)  12/02/21 111 lb 9.6 oz (50.6 kg)    General: Patient appears comfortable at rest. HEENT: Conjunctiva and lids normal. Neck: Supple, no elevated JVP or carotid bruits. Lungs: Clear to auscultation, nonlabored breathing at rest. Cardiac: Regular rate and rhythm, no S3 or significant systolic murmur. Extremities: No pitting edema.  ECG:  An ECG dated 07/14/2022 was personally reviewed today and demonstrated:  Sinus rhythm with nonspecific ST changes.  Labwork:  March 2024: Cholesterol 156, triglycerides 152, HDL 69, LDL 64  September 4098: Hemoglobin 13.1, platelets 279, BUN 11, creatinine 1.06, potassium 4.8, GFR 53, AST 12, ALT 12  Other Studies Reviewed Today:  No interval cardiac testing for review today.  Assessment and Plan:  1.  CAD status post DES to the circumflex and LAD in 2011.  Follow-up Lexiscan Myoview in 2017 demonstrated no evidence of scar or ischemia with normal LVEF.  She does not report any active angina.  Continue aspirin, Imdur, Toprol-XL, and Zocor.   2.  Mixed  hyperlipidemia.  Continue Zocor, LDL 64 in March.  3.  Primary hypertension, largely isolated systolic hypertension.  Plan to initiate Cozaar beginning at 12.5 mg daily.  Check BMET in 10 days.  I encouraged her to check blood pressure periodically at home.  Disposition:  Follow up  6 months.  Signed, Jonelle Sidle, M.D., F.A.C.C. [facility] at [facility]

## 2023-01-30 ENCOUNTER — Other Ambulatory Visit: Payer: Self-pay | Admitting: Cardiology

## 2023-02-18 ENCOUNTER — Telehealth: Payer: Self-pay | Admitting: *Deleted

## 2023-02-18 DIAGNOSIS — Z79899 Other long term (current) drug therapy: Secondary | ICD-10-CM

## 2023-02-18 DIAGNOSIS — I10 Essential (primary) hypertension: Secondary | ICD-10-CM

## 2023-02-18 NOTE — Telephone Encounter (Signed)
Contacted to check status of lab work that was requested at last visit. Reports she forgot and will try to have it done tomorrow at [facility]. BMET order changed to [facility].

## 2023-02-21 ENCOUNTER — Other Ambulatory Visit (HOSPITAL_COMMUNITY)
Admission: RE | Admit: 2023-02-21 | Discharge: 2023-02-21 | Disposition: A | Discharge status: Home / Self Care | Payer: Medicare Other | Source: Ambulatory Visit | Attending: Cardiology | Admitting: Cardiology

## 2023-02-21 DIAGNOSIS — Z79899 Other long term (current) drug therapy: Secondary | ICD-10-CM | POA: Diagnosis present

## 2023-02-21 DIAGNOSIS — I10 Essential (primary) hypertension: Secondary | ICD-10-CM | POA: Insufficient documentation

## 2023-02-21 LAB — BASIC METABOLIC PANEL
Anion gap: 9 (ref 5–15)
BUN: 11 mg/dL (ref 8–23)
CO2: 24 mmol/L (ref 22–32)
Calcium: 10 mg/dL (ref 8.9–10.3)
Chloride: 104 mmol/L (ref 98–111)
Creatinine, Ser: 0.97 mg/dL (ref 0.44–1.00)
GFR, Estimated: 59 mL/min — ABNORMAL LOW (ref >60)
Glucose, Bld: 114 mg/dL — ABNORMAL HIGH (ref 70–99)
Potassium: 3.8 mmol/L (ref 3.5–5.1)
Sodium: 137 mmol/L (ref 135–145)

## 2023-07-08 ENCOUNTER — Other Ambulatory Visit: Payer: Self-pay

## 2023-07-08 ENCOUNTER — Encounter (HOSPITAL_COMMUNITY): Payer: Self-pay

## 2023-07-08 ENCOUNTER — Emergency Department (HOSPITAL_COMMUNITY)
Admission: EM | Admit: 2023-07-08 | Discharge: 2023-07-08 | Disposition: A | Discharge status: Home / Self Care | Attending: Emergency Medicine | Admitting: Emergency Medicine

## 2023-07-08 ENCOUNTER — Emergency Department (HOSPITAL_COMMUNITY)

## 2023-07-08 DIAGNOSIS — Y9389 Activity, other specified: Secondary | ICD-10-CM | POA: Insufficient documentation

## 2023-07-08 DIAGNOSIS — S61211A Laceration without foreign body of left index finger without damage to nail, initial encounter: Secondary | ICD-10-CM | POA: Diagnosis not present

## 2023-07-08 DIAGNOSIS — W208XXA Other cause of strike by thrown, projected or falling object, initial encounter: Secondary | ICD-10-CM | POA: Insufficient documentation

## 2023-07-08 DIAGNOSIS — S6992XA Unspecified injury of left wrist, hand and finger(s), initial encounter: Secondary | ICD-10-CM | POA: Diagnosis present

## 2023-07-08 DIAGNOSIS — Z7982 Long term (current) use of aspirin: Secondary | ICD-10-CM | POA: Insufficient documentation

## 2023-07-08 DIAGNOSIS — Z23 Encounter for immunization: Secondary | ICD-10-CM | POA: Diagnosis not present

## 2023-07-08 MED ORDER — LIDOCAINE HCL (PF) 1 % IJ SOLN
5.0000 mL | Freq: Once | INTRAMUSCULAR | Status: AC
  Administered 2023-07-08: 5 mL
  Filled 2023-07-08: qty 5

## 2023-07-08 MED ORDER — CEPHALEXIN 500 MG PO CAPS
500.0000 mg | ORAL_CAPSULE | Freq: Once | ORAL | Status: AC
  Administered 2023-07-08: 500 mg via ORAL
  Filled 2023-07-08: qty 1

## 2023-07-08 MED ORDER — TETANUS-DIPHTH-ACELL PERTUSSIS 5-2.5-18.5 LF-MCG/0.5 IM SUSY
0.5000 mL | PREFILLED_SYRINGE | Freq: Once | INTRAMUSCULAR | Status: AC
  Administered 2023-07-08: 0.5 mL via INTRAMUSCULAR
  Filled 2023-07-08: qty 0.5

## 2023-07-08 MED ORDER — CEPHALEXIN 500 MG PO CAPS
500.0000 mg | ORAL_CAPSULE | Freq: Two times a day (BID) | ORAL | 0 refills | Status: DC
Start: 2023-07-08 — End: 2024-01-30

## 2023-07-08 NOTE — ED Triage Notes (Signed)
 Pt was moving planks of wood and one plank landed on pt's left pointer finger

## 2023-07-08 NOTE — Discharge Instructions (Signed)
 Today you were seen for a laceration to your left index finger.  Please pick up your antibiotic and take as prescribed.  Please follow-up with your primary care, urgent care, or the ED in approximately 10 days for suture removal.  Please return to the ED if you have puslike discharge from the wound, redness, fever, or uncontrollable vomiting.  Thank you for letting us  treat you today. After reviewing your imaging, I feel you are safe to go home. Please follow up with your PCP in the next several days and provide them with your records from this visit. Return to the Emergency Room if pain becomes severe or symptoms worsen.

## 2023-07-08 NOTE — ED Notes (Signed)
 Wound cleansed and dressed with bandage. Went over [employer] ambulatory to lobby with family.

## 2023-07-08 NOTE — ED Provider Notes (Signed)
 [place] EMERGENCY DEPARTMENT AT [facility] Provider Note   CSN: [id number] Arrival date & time: 07/08/23  1754     History  Chief Complaint  Patient presents with   Finger Injury    Vanessa Fritz is a 78 y.o. female presents today for left pointer finger injury.  Patient states that she was moving a lead in a plank landed on her left pointer finger.  Patient endorses pain and bleeding.  Patient denies numbness, tingling, weakness, any other complaints at this time.  HPI     Home Medications Prior to Admission medications   Medication Sig Start Date End Date Taking? Authorizing Provider  cephALEXin  (KEFLEX ) 500 MG capsule Take 1 capsule (500 mg total) by mouth 2 (two) times daily. 07/08/23  Yes Jermale Crass N, PA-C  acetaminophen  (TYLENOL ) 650 MG CR tablet Take 650 mg by mouth every 4 (four) hours as needed. For headaches    [provider]  aspirin EC 81 MG tablet Take 81 mg by mouth daily.    [provider]  cholecalciferol (VITAMIN D ) 1000 UNITS tablet Take 1,000 Units by mouth daily.     [provider]  isosorbide  mononitrate (IMDUR ) 30 MG 24 hr tablet TAKE 1/2 TABLET BY MOUTH EVERY EVENING 01/31/23   Gerard Knight, MD  LEXAPRO 10 MG tablet Take 10 mg by mouth daily. 12/08/22   [provider]  losartan  (COZAAR ) 25 MG tablet Take 0.5 tablets (12.5 mg total) by mouth daily. 01/17/23   Gerard Knight, MD  metoprolol  succinate (TOPROL -XL) 25 MG 24 hr tablet Take 1 tablet (25 mg total) by mouth daily. 08/17/22   Gerard Knight, MD  Multiple Vitamin (MULTIVITAMIN WITH MINERALS) TABS Take 1 tablet by mouth daily.    [provider]  nitroGLYCERIN  (NITROSTAT ) 0.4 MG SL tablet Place 1 tablet (0.4 mg total) under the tongue every 5 (five) minutes x 3 doses as needed for chest pain (if no relief after 3rd dose, proceed to the ED for an evaluation or call 911). 11/05/20   Gerard Knight, MD  simvastatin  (ZOCOR ) 20 MG  tablet Take 1 tablet (20 mg total) by mouth at bedtime. 08/17/22   Gerard Knight, MD  vitamin B-12 (CYANOCOBALAMIN) 1000 MCG tablet Take 1,000 mcg by mouth daily.    [provider]  vitamin C (ASCORBIC ACID) 500 MG tablet Take 500 mg by mouth daily.    [provider]      Allergies    Sulfonamide derivatives    Review of Systems   Review of Systems  Musculoskeletal:  Positive for arthralgias.  Skin:  Positive for wound.    Physical Exam Updated Vital Signs BP (!) 164/72 (BP Location: Right Arm)   Pulse 61   Temp 98.1 F (36.7 C) (Oral)   Resp 15   Ht 4\' 11"  (1.499 m)   Wt 46.7 kg   SpO2 99%   BMI 20.80 kg/m  Physical Exam Vitals and nursing note reviewed.  Constitutional:      General: She is not in acute distress.    Appearance: Normal appearance. She is well-developed. She is not ill-appearing or diaphoretic.  HENT:     Head: Normocephalic and atraumatic.     Right Ear: External ear normal.     Left Ear: External ear normal.  Eyes:     Conjunctiva/sclera: Conjunctivae normal.  Cardiovascular:     Rate and Rhythm: Normal rate and regular rhythm.  Pulses: Normal pulses.     Heart sounds: Normal heart sounds. No murmur heard. Pulmonary:     Effort: Pulmonary effort is normal. No respiratory distress.  Abdominal:     Palpations: Abdomen is soft.  Musculoskeletal:        General: Signs of injury present.     Cervical back: Neck supple.  Skin:    General: Skin is warm and dry.     Capillary Refill: Capillary refill takes less than 2 seconds.     Findings: Bruising present.     Comments: Patient with 2 small lacerations (totaling approximately 1.5 cm) on the palmar surface of the distal left pointer finger.  Wounds hemostatic on exam.  +2 radial pulses.  Patient able to move hand through full range of motion.  There is mild ecchymosis around the lacerations.  Patient is neurovascularly intact.  No nailbed involvement.  Neurological:      General: No focal deficit present.     Mental Status: She is alert.  Psychiatric:        Mood and Affect: Mood normal.     ED Results / Procedures / Treatments   Labs (all labs ordered are listed, but only abnormal results are displayed) Labs Reviewed - No data to display  EKG None  Radiology DG Finger Index Left Result Date: 07/08/2023 CLINICAL DATA:  Pain after injury.  Crush injury. EXAM: LEFT INDEX FINGER 3V COMPARISON:  None Available. FINDINGS: Osteopenia. Slight degenerative changes of the distal interphalangeal joint with some small osteophytes. No fracture or dislocation. IMPRESSION: Slight degenerative changes along the distal interphalangeal joint. Osteopenia Electronically Signed   By: Adrianna Horde M.D.   On: 07/08/2023 19:05    Procedures .Laceration Repair  Date/Time: 07/08/2023 7:58 PM  Performed by: Carie Charity, PA-C Authorized by: Carie Charity, PA-C   Consent:    Consent obtained:  Verbal   Consent given by:  Patient   Risks discussed:  Infection, pain, poor wound healing and poor cosmetic result   Alternatives discussed:  No treatment Universal protocol:    Imaging studies available: yes     Patient identity confirmed:  Verbally with patient Anesthesia:    Anesthesia method:  Local infiltration   Local anesthetic:  Lidocaine  1% w/o epi Laceration details:    Location:  Finger   Finger location:  L index finger   Length (cm):  1.5   Depth (mm):  2 Pre-procedure details:    Preparation:  Imaging obtained to evaluate for foreign bodies Exploration:    Hemostasis achieved with:  Direct pressure   Imaging obtained: x-ray     Imaging outcome: foreign body not noted     Wound exploration: wound explored through full range of motion and entire depth of wound visualized     Contaminated: yes   Treatment:    Area cleansed with:  Chlorhexidine and soap and water    Amount of cleaning:  Extensive   Irrigation solution:  Sterile water    Irrigation  method:  Pressure wash Skin repair:    Repair method:  Sutures   Suture size:  4-0   Suture material:  Prolene   Suture technique:  Simple interrupted   Number of sutures:  6 Approximation:    Approximation:  Close Repair type:    Repair type:  Simple Post-procedure details:    Dressing:  Antibiotic ointment and non-adherent dressing     Medications Ordered in ED Medications  cephALEXin  (KEFLEX ) capsule 500 mg (has no administration  in time range)  Tdap (BOOSTRIX ) injection 0.5 mL (0.5 mLs Intramuscular Given 07/08/23 1912)  lidocaine  (PF) (XYLOCAINE ) 1 % injection 5 mL (5 mLs Other Given 07/08/23 1913)    ED Course/ Medical Decision Making/ A&P                                 Medical Decision Making  This patient presents to the ED for concern of finger injury differential diagnosis includes open fracture, laceration, closed fracture    Imaging Studies ordered:  I ordered imaging studies including left index finger x-ray I independently visualized and interpreted imaging which showed slight degenerative changes along the distal interphalangeal joint.  No fracture or dislocation I agree with the radiologist interpretation   Medicines ordered and prescription drug management:  I ordered medication including Tdap for tetanus prophylaxis Reevaluation of the patient after these medicines showed that the patient stayed the same I have reviewed the patients home medicines and have made adjustments as needed Patient given first dose of Keflex   Considered for admission or further workup however patient's vital signs, physical exam, and imaging were reassuring.  Patient's laceration was repaired.  Patient placed on short course of Keflex  for infection prevention.  Patient to follow-up with primary care in approximately 10 days for evaluation of suture removal.  Patient given return precautions.        Final Clinical Impression(s) / ED Diagnoses Final diagnoses:   Laceration of left index finger without foreign body without damage to nail, initial encounter    Rx / DC Orders ED Discharge Orders          Ordered    cephALEXin  (KEFLEX ) 500 MG capsule  2 times daily        07/08/23 1958              Bekki Tavenner N, PA-C 07/08/23 Sylvie Every, MD 07/09/23 1513

## 2023-07-25 ENCOUNTER — Ambulatory Visit: Payer: Medicare Other | Attending: Cardiology | Admitting: Cardiology

## 2023-07-25 ENCOUNTER — Encounter: Payer: Self-pay | Admitting: Cardiology

## 2023-07-25 VITALS — BP 122/60 | HR 69 | Ht 59.0 in | Wt 106.6 lb

## 2023-07-25 DIAGNOSIS — I10 Essential (primary) hypertension: Secondary | ICD-10-CM

## 2023-07-25 DIAGNOSIS — I25119 Atherosclerotic heart disease of native coronary artery with unspecified angina pectoris: Secondary | ICD-10-CM

## 2023-07-25 DIAGNOSIS — E782 Mixed hyperlipidemia: Secondary | ICD-10-CM | POA: Diagnosis not present

## 2023-07-25 NOTE — Progress Notes (Signed)
    Cardiology Office Note  Date: 07/25/2023   ID: Trine, [place] 10-31-1945, MRN [id number]  History of Present Illness: Vanessa Fritz is a 78 y.o. female last seen in November 2024.  She is here for a routine visit.  Her husband has passed away (they were married 56 years), she is handling things reasonably well per discussion today.  Spending time in her yard/garden.  No exertional chest pain or nitroglycerin  use with typical activities.  No palpitations or syncope.  We went over her medications.  She reports compliance with therapy.  Plan to review recent lab work per PCP when scanned into the chart.  I rechecked her blood pressure today at 122/60.  I reviewed her ECG today which shows normal sinus rhythm.  Physical Exam: VS:  BP 122/60 (BP Location: Left Arm)   Pulse 69   Ht 4\' 11"  (1.499 m)   Wt 106 lb 9.6 oz (48.4 kg)   SpO2 99%   BMI 21.53 kg/m , BMI Body mass index is 21.53 kg/m.  Wt Readings from Last 3 Encounters:  07/25/23 106 lb 9.6 oz (48.4 kg)  07/08/23 103 lb (46.7 kg)  01/17/23 111 lb 9.6 oz (50.6 kg)    General: Patient appears comfortable at rest. HEENT: Conjunctiva and lids normal. Neck: Supple, no elevated JVP or carotid bruits. Lungs: Clear to auscultation, nonlabored breathing at rest. Cardiac: Regular rate and rhythm, no S3 or significant systolic murmur. Extremities: No pitting edema.  ECG:  An ECG dated 07/14/2022 was personally reviewed today and demonstrated:  Sinus rhythm with nonspecific ST changes.  Labwork: March 2024: Cholesterol 156, triglycerides 152, HDL 69, LDL 64  September 4098: Hemoglobin 13.1, platelets 279, BUN 11, creatinine 1.06, potassium 4.8, GFR 53, AST 12, ALT 12 02/21/2023: BUN 11; Creatinine, Ser 0.97; Potassium 3.8; Sodium 137   Other Studies Reviewed Today:  No interval cardiac testing for review today.  Assessment and Plan:  1.  CAD status post DES to the circumflex and LAD in 2011.  Follow-up Lexiscan  Myoview  in 2017 demonstrated no evidence of scar or ischemia with normal LVEF.  We have continued with observation in the absence of angina, ECG reviewed and normal today.  Continue medical therapy including aspirin 81 mg daily, Imdur  15 mg daily, Zocor  20 mg daily, and as needed nitroglycerin .   2.  Mixed hyperlipidemia.  LDL 64 in March of last year.  Plan to review interval lab work from this year.  Continue Zocor  20 mg daily for now.   3.  Primary hypertension.  Continue losartan  12.5 mg daily and Toprol -XL 25 mg daily.  Disposition:  Follow up 6 months.  Signed, Gerard Knight, M.D., F.A.C.C. [facility] at [facility]

## 2023-07-25 NOTE — Patient Instructions (Addendum)

## 2023-08-20 ENCOUNTER — Other Ambulatory Visit: Payer: Self-pay

## 2023-08-20 ENCOUNTER — Encounter (HOSPITAL_COMMUNITY): Payer: Self-pay | Admitting: *Deleted

## 2023-08-20 ENCOUNTER — Emergency Department (HOSPITAL_COMMUNITY)
Admission: EM | Admit: 2023-08-20 | Discharge: 2023-08-20 | Disposition: A | Discharge status: Home / Self Care | Attending: Emergency Medicine | Admitting: Emergency Medicine

## 2023-08-20 DIAGNOSIS — M25512 Pain in left shoulder: Secondary | ICD-10-CM | POA: Insufficient documentation

## 2023-08-20 DIAGNOSIS — X501XXA Overexertion from prolonged static or awkward postures, initial encounter: Secondary | ICD-10-CM | POA: Diagnosis not present

## 2023-08-20 DIAGNOSIS — S199XXA Unspecified injury of neck, initial encounter: Secondary | ICD-10-CM | POA: Diagnosis present

## 2023-08-20 DIAGNOSIS — S161XXA Strain of muscle, fascia and tendon at neck level, initial encounter: Secondary | ICD-10-CM | POA: Diagnosis not present

## 2023-08-20 DIAGNOSIS — Z7982 Long term (current) use of aspirin: Secondary | ICD-10-CM | POA: Insufficient documentation

## 2023-08-20 MED ORDER — DICLOFENAC SODIUM 1 % EX GEL: 4.0000 g | Freq: Four times a day (QID) | CUTANEOUS | 0 refills | Status: AC

## 2023-08-20 MED ORDER — METHOCARBAMOL 500 MG PO TABS
500.0000 mg | ORAL_TABLET | Freq: Two times a day (BID) | ORAL | 0 refills | Status: AC | PRN
Start: 2023-08-20 — End: ?

## 2023-08-20 NOTE — ED Triage Notes (Addendum)
 Pt states working in the garden, pulling pieces of a stump earlier this week.  Felt a pop in left shoulder and having pain since.

## 2023-08-20 NOTE — ED Provider Notes (Signed)
 [place] EMERGENCY DEPARTMENT AT [facility] Provider Note   CSN: [id number] Arrival date & time: 08/20/23  1500     History  Chief Complaint  Patient presents with   Shoulder Pain    Vanessa Fritz is a 78 y.o. female.   Shoulder Pain  This patient is an 78 year old female who reports that last week she was trying to pull out some roots out of an old stump, she felt a pop in the back of her trapezius on the left side and did okay initially but now is having some increasing pain in that area, sometimes there are pins-and-needles feelings in her left arm including the upper arm and the forearm, she has no weakness or numbness and has no radiation of any discomfort into her hand.  She has no chest pain, back pain, shortness of breath.  She does report a history of injuring the same side and required a sling for a while that got better.  She has been taking some over-the-counter medications without relief    Home Medications Prior to Admission medications   Medication Sig Start Date End Date Taking? Authorizing Provider  diclofenac Sodium (VOLTAREN) 1 % GEL Apply 4 g topically 4 (four) times daily for 7 days. 08/20/23 08/27/23 Yes Early Glisson, MD  methocarbamol (ROBAXIN) 500 MG tablet Take 1 tablet (500 mg total) by mouth 2 (two) times daily as needed for muscle spasms. 08/20/23  Yes Early Glisson, MD  acetaminophen  (TYLENOL ) 650 MG CR tablet Take 650 mg by mouth every 4 (four) hours as needed. For headaches    [provider]  amoxicillin (AMOXIL) 500 MG tablet Take 500 mg by mouth every 8 (eight) hours. 07/21/23   [provider]  aspirin EC 81 MG tablet Take 81 mg by mouth daily.    [provider]  cephALEXin  (KEFLEX ) 500 MG capsule Take 1 capsule (500 mg total) by mouth 2 (two) times daily. 07/08/23   Keith, Kayla N, PA-C  cholecalciferol (VITAMIN D ) 1000 UNITS tablet Take 1,000 Units by mouth daily.     [provider]  isosorbide   mononitrate (IMDUR ) 30 MG 24 hr tablet TAKE 1/2 TABLET BY MOUTH EVERY EVENING 01/31/23   Gerard Knight, MD  LEXAPRO 10 MG tablet Take 10 mg by mouth daily. 12/08/22   [provider]  losartan  (COZAAR ) 25 MG tablet Take 0.5 tablets (12.5 mg total) by mouth daily. 01/17/23   Gerard Knight, MD  metoprolol  succinate (TOPROL -XL) 25 MG 24 hr tablet Take 1 tablet (25 mg total) by mouth daily. 08/17/22   Gerard Knight, MD  Multiple Vitamin (MULTIVITAMIN WITH MINERALS) TABS Take 1 tablet by mouth daily.    [provider]  nitroGLYCERIN  (NITROSTAT ) 0.4 MG SL tablet Place 1 tablet (0.4 mg total) under the tongue every 5 (five) minutes x 3 doses as needed for chest pain (if no relief after 3rd dose, proceed to the ED for an evaluation or call 911). 11/05/20   Gerard Knight, MD  simvastatin  (ZOCOR ) 20 MG tablet Take 1 tablet (20 mg total) by mouth at bedtime. 08/17/22   Gerard Knight, MD  vitamin B-12 (CYANOCOBALAMIN) 1000 MCG tablet Take 1,000 mcg by mouth daily.    [provider]  vitamin C (ASCORBIC ACID) 500 MG tablet Take 500 mg by mouth daily.    [provider]      Allergies    Sulfonamide derivatives    Review of Systems  Review of Systems  All other systems reviewed and are negative.   Physical Exam Updated Vital Signs BP (!) 158/57 (BP Location: Right Arm)   Pulse 93   Temp 98.5 F (36.9 C) (Oral)   Resp 14   Ht 1.499 m (4\' 11" )   Wt 46.3 kg   SpO2 100%   BMI 20.60 kg/m  Physical Exam Vitals and nursing note reviewed.  Constitutional:      General: She is not in acute distress.    Appearance: She is well-developed.  HENT:     Head: Normocephalic and atraumatic.     Mouth/Throat:     Pharynx: No oropharyngeal exudate.  Eyes:     General: No scleral icterus.       Right eye: No discharge.        Left eye: No discharge.     Conjunctiva/sclera: Conjunctivae normal.     Pupils: Pupils are equal, round, and reactive to  light.  Neck:     Thyroid: No thyromegaly.     Vascular: No JVD.  Cardiovascular:     Rate and Rhythm: Normal rate and regular rhythm.     Heart sounds: Normal heart sounds. No murmur heard.    No friction rub. No gallop.  Pulmonary:     Effort: Pulmonary effort is normal. No respiratory distress.     Breath sounds: Normal breath sounds. No wheezing or rales.  Abdominal:     General: Bowel sounds are normal. There is no distension.     Palpations: Abdomen is soft. There is no mass.     Tenderness: There is no abdominal tenderness.  Musculoskeletal:        General: Tenderness present. Normal range of motion.     Cervical back: Normal range of motion and neck supple.     Right lower leg: No edema.     Left lower leg: No edema.     Comments: The patient has totally normal range of motion of the bilateral shoulders elbows wrist and hands.  Her left shoulder specifically has no tenderness of the shoulder capsule although there is mild tenderness in the trapezius and upper limb to the lateral neck on the left.  There is no spinal tenderness of the cervical thoracic or lumbar spines.  She has good strength of the biceps triceps as well as her grip, she has totally normal sensation of the bilateral upper extremities including all of the fingers of the hand  Lymphadenopathy:     Cervical: No cervical adenopathy.  Skin:    General: Skin is warm and dry.     Findings: No erythema or rash.  Neurological:     Mental Status: She is alert.     Coordination: Coordination normal.  Psychiatric:        Behavior: Behavior normal.     ED Results / Procedures / Treatments   Labs (all labs ordered are listed, but only abnormal results are displayed) Labs Reviewed - No data to display  EKG None  Radiology No results found.  Procedures Procedures    Medications Ordered in ED Medications - No data to display  ED Course/ Medical Decision Making/ A&P                                  Medical Decision Making  Unremarkable exam, I suspect that she has a muscle strain in the neck and shoulder, there is no  torticollis and she has good range of motion of the head and the neck.  Will prescribe topical diclofenac gel as well as a course of Robaxin, there is a family member at the bedside who is agreeable to help her get these medications and use them appropriately.  Stable for discharge, no indication for imaging`  ` No neurologic deficit on exm.        Final Clinical Impression(s) / ED Diagnoses Final diagnoses:  Neck strain, initial encounter    Rx / DC Orders ED Discharge Orders          Ordered    diclofenac Sodium (VOLTAREN) 1 % GEL  4 times daily        08/20/23 1524    methocarbamol (ROBAXIN) 500 MG tablet  2 times daily PRN        08/20/23 1524              Early Glisson, MD 08/20/23 1524

## 2023-08-20 NOTE — ED Notes (Signed)
 Pt/family received d/c paperwork at this time. After going over the paperwork any questions, comments, or concerns were answered to the best of this nurse's knowledge. The pt/family verbally acknowledged the teachings/instructions.

## 2023-08-20 NOTE — Discharge Instructions (Addendum)
 You may use the topical diclofenac gel as a pain medication, topical lidocaine  patches also help, Robaxin can be used as a muscle relaxer, warm compresses intermittently.  See your doctor within 2 to 3 days but be aware that this type of pain can go on for weeks and gradually improve.  You should see your family doctor in this coming week to help with some physical therapy to make this get better faster.  ER for numbness weakness or severe worsening pain

## 2023-08-27 ENCOUNTER — Emergency Department (HOSPITAL_COMMUNITY)

## 2023-08-27 ENCOUNTER — Other Ambulatory Visit: Payer: Self-pay

## 2023-08-27 ENCOUNTER — Emergency Department (HOSPITAL_COMMUNITY)
Admission: EM | Admit: 2023-08-27 | Discharge: 2023-08-27 | Disposition: A | Discharge status: Home / Self Care | Attending: Emergency Medicine | Admitting: Emergency Medicine

## 2023-08-27 ENCOUNTER — Encounter (HOSPITAL_COMMUNITY): Payer: Self-pay

## 2023-08-27 DIAGNOSIS — M542 Cervicalgia: Secondary | ICD-10-CM | POA: Insufficient documentation

## 2023-08-27 DIAGNOSIS — M25512 Pain in left shoulder: Secondary | ICD-10-CM | POA: Diagnosis present

## 2023-08-27 DIAGNOSIS — Z7982 Long term (current) use of aspirin: Secondary | ICD-10-CM | POA: Insufficient documentation

## 2023-08-27 DIAGNOSIS — M549 Dorsalgia, unspecified: Secondary | ICD-10-CM | POA: Insufficient documentation

## 2023-08-27 MED ORDER — LIDOCAINE 5 % EX PTCH
1.0000 | MEDICATED_PATCH | CUTANEOUS | Status: DC
  Administered 2023-08-27: 1 via TRANSDERMAL
  Filled 2023-08-27: qty 1

## 2023-08-27 NOTE — Discharge Instructions (Signed)
 No fractures on the x-ray.  You were given a sling.  Follow-up with your primary care doctor.  Have also listed an orthopedist you can follow-up with if it does not improve.  Return for any concerning symptoms.

## 2023-08-27 NOTE — ED Provider Notes (Signed)
 [facility] AT [facility] Provider Note   CSN: [id number] Arrival date & time: 08/27/23  4098     Patient presents with: Shoulder Pain and Neck Pain   [facility] is a 78 y.o. female.   78 year old female presents with her daughter for concern of persistent left shoulder pain.  She was seen for this 1 week ago and muscle strain/spasm and given muscle relaxer as well as Voltaren  cream.  She states despite using these medications she has not had any significant relief.  In addition to the prescribed medicines she has been using IcyHot as well as an over-the-counter patch.  She however continues to use her left arm for chores around the house.  She denies any chest pain, shortness of breath, nausea, lightheadedness, or other anginal symptoms.  The history is provided by the patient. No language interpreter was used.       Prior to Admission medications   Medication Sig Start Date End Date Taking? Authorizing Provider  acetaminophen  (TYLENOL ) 650 MG CR tablet Take 650 mg by mouth every 4 (four) hours as needed. For headaches    [provider]  amoxicillin (AMOXIL) 500 MG tablet Take 500 mg by mouth every 8 (eight) hours. 07/21/23   [provider]  aspirin EC 81 MG tablet Take 81 mg by mouth daily.    [provider]  cephALEXin  (KEFLEX ) 500 MG capsule Take 1 capsule (500 mg total) by mouth 2 (two) times daily. 07/08/23   Keith, Kayla N, PA-C  cholecalciferol (VITAMIN D ) 1000 UNITS tablet Take 1,000 Units by mouth daily.     [provider]  diclofenac  Sodium (VOLTAREN ) 1 % GEL Apply 4 g topically 4 (four) times daily for 7 days. 08/20/23 08/27/23  Early Glisson, MD  isosorbide  mononitrate (IMDUR ) 30 MG 24 hr tablet TAKE 1/2 TABLET BY MOUTH EVERY EVENING 01/31/23   Gerard Knight, MD  LEXAPRO 10 MG tablet Take 10 mg by mouth daily. 12/08/22   [provider]  losartan  (COZAAR ) 25 MG tablet Take 0.5 tablets (12.5  mg total) by mouth daily. 01/17/23   Gerard Knight, MD  methocarbamol  (ROBAXIN ) 500 MG tablet Take 1 tablet (500 mg total) by mouth 2 (two) times daily as needed for muscle spasms. 08/20/23   Early Glisson, MD  metoprolol  succinate (TOPROL -XL) 25 MG 24 hr tablet Take 1 tablet (25 mg total) by mouth daily. 08/17/22   Gerard Knight, MD  Multiple Vitamin (MULTIVITAMIN WITH MINERALS) TABS Take 1 tablet by mouth daily.    [provider]  nitroGLYCERIN  (NITROSTAT ) 0.4 MG SL tablet Place 1 tablet (0.4 mg total) under the tongue every 5 (five) minutes x 3 doses as needed for chest pain (if no relief after 3rd dose, proceed to the ED for an evaluation or call 911). 11/05/20   Gerard Knight, MD  simvastatin  (ZOCOR ) 20 MG tablet Take 1 tablet (20 mg total) by mouth at bedtime. 08/17/22   Gerard Knight, MD  vitamin B-12 (CYANOCOBALAMIN) 1000 MCG tablet Take 1,000 mcg by mouth daily.    [provider]  vitamin C (ASCORBIC ACID) 500 MG tablet Take 500 mg by mouth daily.    [provider]    Allergies: Sulfonamide derivatives    Review of Systems  Constitutional:  Negative for diaphoresis.  Respiratory:  Negative for shortness of breath.   Cardiovascular:  Negative for chest pain.  Gastrointestinal:  Negative for abdominal pain and nausea.  Musculoskeletal:  Positive for arthralgias.  All other systems reviewed and are negative.   Updated Vital Signs BP (!) 165/63 (BP Location: Right Arm)   Pulse 83   Temp 98 F (36.7 C) (Oral)   Resp 18   Ht 4' 11 (1.499 m)   Wt 46.3 kg   SpO2 100%   BMI 20.60 kg/m   Physical Exam Vitals and nursing note reviewed.  Constitutional:      General: She is not in acute distress.    Appearance: Normal appearance. She is not ill-appearing.  HENT:     Head: Normocephalic and atraumatic.     Nose: Nose normal.   Eyes:     Conjunctiva/sclera: Conjunctivae normal.   Pulmonary:     Effort: Pulmonary effort is normal. No  respiratory distress.   Musculoskeletal:        General: No deformity. Normal range of motion.     Cervical back: Normal range of motion.     Comments: Patient without any chest wall tenderness to palpation.  Has good range of motion in bilateral shoulder joints.  Good range of motion in her cervical spine.  No tenderness palpation of shoulder but she does have some tenderness over the trapezius muscle.  Has good grip strength as well as good strength in her bicep and triceps.  Neurovascularly intact in bilateral upper extremities.  There is some reproducible pain over the trapezius just along the lateral left side of her cervical paraspinal muscles.   Skin:    Findings: No rash.   Neurological:     Mental Status: She is alert.     (all labs ordered are listed, but only abnormal results are displayed) Labs Reviewed - No data to display  EKG: None  Radiology: No results found.   Procedures   Medications Ordered in the ED  lidocaine  (LIDODERM ) 5 % 1 patch (1 patch Transdermal Patch Applied 08/27/23 1048)                                    Medical Decision Making Amount and/or Complexity of Data Reviewed Radiology: ordered.  Risk Prescription drug management.   Medical Decision Making / ED Course   This patient presents to the ED for concern of left shoulder pain, this involves an extensive number of treatment options, and is a complaint that carries with it a high risk of complications and morbidity.  The differential diagnosis includes strain, muscle spasm, fracture, contusion  MDM: 78 year old female presents today for concern of left upper back pain.  This started about 1 week ago after she was working in the yard.  She was seen last weekend discharged with Voltaren  gel and Robaxin  however she states these medicines have not helped.  She has continued to use her left arm and has not given out any significant amount of rest.  She has not seen her primary care doctor.   She continues to have symptoms.  Seems though she would like imaging to ensure there is nothing bony going on. Without anginal symptoms.  Low suspicion for ACS. X-ray normal. Will give sling to promote rest of these muscles to allow healing.  She will follow-up with her primary care doctor.  Ortho referral given in case symptoms do not improve. Patient and daughter voiced understanding and are in agreement with plan. Discussed with attending.   Additional history obtained: -Additional history obtained from daughter at bedside,  chart review -External records from outside source obtained and reviewed including: Chart review including previous notes, labs, imaging, consultation notes   Lab Tests: -I ordered, reviewed, and interpreted labs.   The pertinent results include:   Labs Reviewed - No data to display    EKG  EKG Interpretation Date/Time:    Ventricular Rate:    PR Interval:    QRS Duration:    QT Interval:    QTC Calculation:   R Axis:      Text Interpretation:           Imaging Studies ordered: I ordered imaging studies including left shoulder x-ray I independently visualized and interpreted imaging. I agree with the radiologist interpretation   Medicines ordered and prescription drug management: Meds ordered this encounter  Medications   lidocaine  (LIDODERM ) 5 % 1 patch    -I have reviewed the patients home medicines and have made adjustments as needed  Social Determinants of Health:  Factors impacting patients care include: Good family support   Reevaluation: After the interventions noted above, I reevaluated the patient and found that they have :improved  Co morbidities that complicate the patient evaluation  Past Medical History:  Diagnosis Date   Coronary atherosclerosis of native coronary artery    Multivessel, LVEF 65%, DES circumflex and LAD 8/11   Hyperlipidemia    Myocardial infarction [facility])       Dispostion: Discharged in stable  condition.  Return precaution discussed.  Patient voices understanding and is in agreement with plan.    Final diagnoses:  Left shoulder pain, unspecified chronicity    ED Discharge Orders     None          Lucina Sabal, PA-C 08/27/23 1141    Early Glisson, MD 08/28/23 1434

## 2023-08-27 NOTE — ED Triage Notes (Signed)
 Pt complains of left shoulder and neck pain. Pt states she was seen in ED a week ago and given muscle relaxer. Pt states medication is not effective and pain is getting worse. Pt denies fall or injury.

## 2023-09-05 ENCOUNTER — Other Ambulatory Visit: Payer: Self-pay | Admitting: Cardiology

## 2023-09-28 ENCOUNTER — Other Ambulatory Visit: Payer: Self-pay | Admitting: Cardiology

## 2023-11-03 ENCOUNTER — Other Ambulatory Visit (HOSPITAL_COMMUNITY): Payer: Self-pay | Admitting: Nurse Practitioner

## 2023-11-03 DIAGNOSIS — Z1231 Encounter for screening mammogram for malignant neoplasm of breast: Secondary | ICD-10-CM

## 2023-11-27 ENCOUNTER — Other Ambulatory Visit: Payer: Self-pay | Admitting: Cardiology

## 2023-12-16 ENCOUNTER — Ambulatory Visit (HOSPITAL_COMMUNITY)
Admission: RE | Admit: 2023-12-16 | Discharge: 2023-12-16 | Disposition: A | Discharge status: Home / Self Care | Source: Ambulatory Visit | Attending: Nurse Practitioner | Admitting: Nurse Practitioner

## 2023-12-16 ENCOUNTER — Encounter (HOSPITAL_COMMUNITY): Payer: Self-pay

## 2023-12-16 DIAGNOSIS — Z1231 Encounter for screening mammogram for malignant neoplasm of breast: Secondary | ICD-10-CM | POA: Diagnosis present

## 2024-01-11 ENCOUNTER — Encounter: Payer: Self-pay | Admitting: Cardiology

## 2024-01-30 ENCOUNTER — Encounter: Payer: Self-pay | Admitting: Cardiology

## 2024-01-30 ENCOUNTER — Ambulatory Visit: Attending: Cardiology | Admitting: Cardiology

## 2024-01-30 VITALS — BP 130/62 | HR 84 | Ht 59.0 in | Wt 101.8 lb

## 2024-01-30 DIAGNOSIS — E782 Mixed hyperlipidemia: Secondary | ICD-10-CM

## 2024-01-30 DIAGNOSIS — I10 Essential (primary) hypertension: Secondary | ICD-10-CM | POA: Diagnosis not present

## 2024-01-30 DIAGNOSIS — I25119 Atherosclerotic heart disease of native coronary artery with unspecified angina pectoris: Secondary | ICD-10-CM

## 2024-01-30 NOTE — Progress Notes (Signed)
    Cardiology Office Note  Date: 01/30/2024   ID: Prue, Lingenfelter August 22, 1945, MRN [id number]  History of Present Illness: [facility] is an 78 y.o. female last seen in May.  She is here for a follow-up visit.  Reports no angina or interval nitroglycerin  use, stable NYHA class II dyspnea, no palpitations or syncope.  Remains functional with ADLs.  Still grieving the loss of her husband who passed away back in 04-23-2023.  I reviewed her medications.  She reports compliance with therapy.  Recent lab work from October is noted below.  Physical Exam: VS:  BP 130/62 (BP Location: Left Arm, Patient Position: Sitting, Cuff Size: Normal)   Pulse 84   Ht 4' 11 (1.499 m)   Wt 101 lb 12.8 oz (46.2 kg)   SpO2 98%   BMI 20.56 kg/m , BMI Body mass index is 20.56 kg/m.  Wt Readings from Last 3 Encounters:  01/30/24 101 lb 12.8 oz (46.2 kg)  08/27/23 102 lb (46.3 kg)  08/20/23 102 lb (46.3 kg)    General: Patient appears comfortable at rest. HEENT: Conjunctiva and lids normal. Neck: Supple, no elevated JVP or carotid bruits. Lungs: Clear to auscultation, nonlabored breathing at rest. Cardiac: Regular rate and rhythm, no S3 or significant systolic murmur. Extremities: No pitting edema.  ECG:  An ECG dated 07/25/2023 was personally reviewed today and demonstrated:  Sinus rhythm.  Labwork: 02/21/2023: BUN 11; Creatinine, Ser 0.97; Potassium 3.8; Sodium 137  October 2025: Cholesterol 178, triglycerides 100, HDL 71, LDL 87, BUN 10, creatinine 1.06, potassium 4.8, GFR 53, AST 15, ALT 13, hemoglobin 13, platelets 346  Other Studies Reviewed Today:  No interval cardiac testing for review today.  Assessment and Plan:  1.  CAD status post DES to the circumflex and LAD in 2011.  Follow-up Lexiscan  Myoview in 2017 demonstrated no evidence of scar or ischemia with normal LVEF.  We have held off on follow-up ischemic testing in the absence of angina and no specific change in functional  capacity.  Continue aspirin 81 mg daily, Zocor  20 mg daily, and as needed nitroglycerin .   2.  Mixed hyperlipidemia.  Continue Zocor  20 mg daily.  HDL 71 and LDL 86 in October.   3.  Primary hypertension.  No change to current regimen including Toprol -XL 25 mg daily and Cozaar  12.5 mg daily.  Disposition:  Follow up 6 months.  Signed, Jayson JUDITHANN Sierras, M.D., F.A.C.C. [facility]

## 2024-01-30 NOTE — Patient Instructions (Addendum)
